# Patient Record
Sex: Female | Born: 1940 | Hispanic: No
Health system: Southern US, Community
[De-identification: ages and names within clinical notes are randomized; demographics above are authoritative.]

---

## 1998-04-29 ENCOUNTER — Other Ambulatory Visit: Admission: RE | Admit: 1998-04-29 | Discharge: 1998-04-29 | Payer: Self-pay | Admitting: Obstetrics & Gynecology

## 2000-03-23 ENCOUNTER — Encounter: Admission: RE | Admit: 2000-03-23 | Discharge: 2000-03-23 | Payer: Self-pay | Admitting: Family Medicine

## 2000-03-23 ENCOUNTER — Encounter: Payer: Self-pay | Admitting: Family Medicine

## 2001-03-27 ENCOUNTER — Encounter: Payer: Self-pay | Admitting: General Surgery

## 2001-04-02 ENCOUNTER — Inpatient Hospital Stay (HOSPITAL_COMMUNITY): Admission: RE | Admit: 2001-04-02 | Discharge: 2001-04-04 | Payer: Self-pay | Admitting: General Surgery

## 2001-04-02 ENCOUNTER — Encounter (INDEPENDENT_AMBULATORY_CARE_PROVIDER_SITE_OTHER): Payer: Self-pay

## 2002-01-29 ENCOUNTER — Encounter: Admission: RE | Admit: 2002-01-29 | Discharge: 2002-01-29 | Payer: Self-pay | Admitting: Family Medicine

## 2002-01-29 ENCOUNTER — Encounter: Payer: Self-pay | Admitting: Family Medicine

## 2003-12-05 ENCOUNTER — Encounter: Admission: RE | Admit: 2003-12-05 | Discharge: 2003-12-05 | Payer: Self-pay | Admitting: Family Medicine

## 2003-12-26 ENCOUNTER — Ambulatory Visit (HOSPITAL_COMMUNITY): Admission: RE | Admit: 2003-12-26 | Discharge: 2003-12-26 | Payer: Self-pay | Admitting: Gastroenterology

## 2004-08-20 ENCOUNTER — Other Ambulatory Visit: Admission: RE | Admit: 2004-08-20 | Discharge: 2004-08-20 | Payer: Self-pay | Admitting: Family Medicine

## 2005-10-14 ENCOUNTER — Other Ambulatory Visit: Admission: RE | Admit: 2005-10-14 | Discharge: 2005-10-14 | Payer: Self-pay | Admitting: Family Medicine

## 2005-11-18 ENCOUNTER — Encounter: Admission: RE | Admit: 2005-11-18 | Discharge: 2005-11-18 | Payer: Self-pay | Admitting: Family Medicine

## 2005-12-30 ENCOUNTER — Encounter: Admission: RE | Admit: 2005-12-30 | Discharge: 2005-12-30 | Payer: Self-pay | Admitting: Family Medicine

## 2007-02-28 ENCOUNTER — Other Ambulatory Visit: Admission: RE | Admit: 2007-02-28 | Discharge: 2007-02-28 | Payer: Self-pay | Admitting: Family Medicine

## 2008-03-05 ENCOUNTER — Other Ambulatory Visit: Admission: RE | Admit: 2008-03-05 | Discharge: 2008-03-05 | Payer: Self-pay | Admitting: Family Medicine

## 2011-04-19 ENCOUNTER — Other Ambulatory Visit: Payer: Self-pay | Admitting: Family Medicine

## 2011-04-19 ENCOUNTER — Other Ambulatory Visit (HOSPITAL_COMMUNITY)
Admission: RE | Admit: 2011-04-19 | Discharge: 2011-04-19 | Disposition: A | Discharge status: Home / Self Care | Payer: Medicare Other | Source: Ambulatory Visit | Attending: Dermatology | Admitting: Dermatology

## 2011-04-19 DIAGNOSIS — Z01419 Encounter for gynecological examination (general) (routine) without abnormal findings: Secondary | ICD-10-CM | POA: Insufficient documentation

## 2011-05-06 NOTE — Op Note (Signed)
NAME:  Catherine Randolph, Catherine Randolph                          ACCOUNT NO.:  [id number]   MEDICAL RECORD NO.:  [id number]                   PATIENT TYPE:  AMB   LOCATION:  ENDO                                 FACILITY:  [facility]   PHYSICIAN:  Graylin Shiver, M.D.                DATE OF BIRTH:  March 20, 1941   DATE OF PROCEDURE:  12/26/2003  DATE OF DISCHARGE:                                 OPERATIVE REPORT   PROCEDURE:  Colonoscopy to the hepatic flexure region.   INDICATION FOR PROCEDURE:  Screening.   Informed consent was obtained after explanation of the risks of bleeding,  infection, and perforation.   PREMEDICATIONS:  1. Fentanyl 75 mcg IV.  2. Versed 8 mg IV.   DESCRIPTION OF PROCEDURE:  With the patient in the left lateral decubitus  position, a rectal exam was performed; no masses were felt.  The Olympus  pediatric adjustable colonoscope was inserted into the rectum and advanced  around a tortuous colon to the level of the hepatic flexure.  I was able to  advance the scope just around the hepatic flexure but could not advance it  any further down into the ascending colon or to the cecum.  I placed the  patient on her back; I placed her on her right side; I placed her on her  abdomen but despite these different positions and also flank pressure to  various points on the abdomen, the scope would not advance.  The patient  __________ cholecystectomy.  The scope was brought out visualizing the  colonic mucosa.  No abnormalities were seen.  She tolerated the procedure  well without complications.   IMPRESSION:  Incomplete colonoscopy to the region of the hepatic flexure  with no abnormalities seen up to this point.   PLAN:  I will order a barium enema to evaluate the more proximal colon.                                               Graylin Shiver, M.D.    SFG/MEDQ  D:  12/26/2003  T:  12/26/2003  Job:  [id number]   cc:   Holley Bouche, M.D.  [address]  [place], [place]  [address]  Fax: [phone]

## 2011-05-06 NOTE — Op Note (Signed)
[facility]  Patient:    Catherine Randolph, Catherine Randolph                MRN: [id number] Proc. Date: 04/02/01 Attending:  Angelia Mould. Derrell Lolling, M.D. CC:         Arvella Merles, M.D.   Operative Report  PREOPERATIVE DIAGNOSES: 1. Incarcerated ventral incisional hernia. 2. Gallstones.  POSTOPERATIVE DIAGNOSES: 1. Incarcerated ventral incisional hernia. 2. Gallstones.  PROCEDURES: 1. Laparoscopic repair of incarcerated ventral hernia with dual mesh. 2. Laparoscopic cholecystectomy.  SURGEON:  Angelia Mould. Derrell Lolling, M.D.  FIRST ASSISTANT:  Currie Paris, M.D.  OPERATIVE INDICATIONS:  This is a 70 year old white female who has had two C-sections in the past.  She has noticed a progressive bulge and some discomfort below and to the left of her umbilicus for about two years, and she thinks this is getting worse.  In the more distant past, she has had a couple of episodes of upper abdominal pain radiating to her back after meals but has not had any more severe attacks like that.  She has minimal indigestion. Recent ultrasound shows the gallbladder completely filled with stones, and the posterior wall of the gallbladder could not be well seen.  The liver and bile ducts otherwise looked normal.  Liver function tests are normal.  CT scan of the abdomen and pelvis recently showed ventral hernia in the lower abdominal wall just to the left of midline with a loop of small bowel present within the hernia but not obstructed.  She was seen in surgical consultation.  I felt that she was at significant longterm risk of incarceration and strangulation of her ventral hernia and also felt that it would be reasonable to remove her gallbladder at the same time as the surgery.  She is brought to the operating room electively.  OPERATIVE FINDINGS:  The patient had an incarcerated ventral hernia with a lot of omentum and one loop of small bowel caught within the hernia.  We  were able to dissect the omentum out of the hernia and to tease the small bowel out without any trauma whatsoever.  The rest of the small intestine and large intestine looks normal.  The liver appeared healthy.  The gallbladder was chronically inflamed, clearly thick-walled, fairly extensive adhesions to it with a lot of fibrosis on the posterior wall and fairly sticky dissection of the gallbladder out of the liver bed.  The anatomy of the cystic duct, cystic artery, and common bile duct appeared conventional.  The stomach and duodenum appeared normal.  DESCRIPTION OF PROCEDURE:  Following the induction of general endotracheal anesthesia, a Foley catheter and oral-gastric tube were inserted.  The abdomen was prepped and draped in a sterile fashion.  The initial trocar was placed in the left flank.  A transverse incision was made in the left flank at about the anterior axillary line.  The fascia was incised.  Retractors were placed, and the peritoneum was identified.  The peritoneum was elevated with hemostats and incised and the abdominal cavity entered under direct vision.  A 10 mm Hasson trocar was inserted and secured with a pursestring suture of 0 Vicryl. Pneumoperitoneum was created.  The video camera was inserted with the visualization and findings as described above.  We placed a 5 mm trocar in the left lower quadrant under direct vision.  We placed a 10 mm trocar in the midline about 4 cm above the umbilicus.  We felt that we could use this to operate  upon the lower abdominal ventral hernia as well as perform the cholecystectomy.  Using blunt dissection and counterpulsation from the abdominal wall and the Harmonic scalpel, we dissected the omentum out of the hernia defect.  We very carefully identified the loop of small bowel that was within the hernia, and we were able to tease this out of the sac atraumatically and divide the omental attachments well away from the bowel.  Once  we had completely reduced all the hernia contents, we examined the small bowel carefully, and it appeared to be fine.  The defect in the fascia was noted in the lower abdominal wall just to the left of the midline.  The defect was about 6 or 7 cm in diameter, and above this in the midline there was apparently some weakness in the fascia.  We used spinal needles to mark the edges of the hernia.  Marking pen was used.  We ultimately drew a template on the abdominal wall of the intended repair and marked an area 15 x 15 cm.  We removed the spinal needles.  We drew the template on the abdominal wall and marked the 12 oclock, 3 oclock, 6 oclock, and 9 oclock positions.  A piece of dual mesh was brought to the operative field, which was slightly larger than the template, and it was cut in appropriate shape to match the intended repair of the defect.  The mesh was marked to correspond with the abdominal wall markings.  Four interrupted sutures of #1 Novofil were placed in the mesh at the 12 oclock, 3 oclock, 6 oclock, and 9 oclock positions.  After marking the mesh, the mesh was rolled up and inserted into the abdominal cavity. Under direct vision, the mesh was then unrolled so that the smooth side was placed toward the viscera and the rough side placed toward the abdominal wall peritoneal surface.  The Novofil sutures were brought through the abdominal wall peripheral to the marked template.  These were brought through the abdominal wall separately with an Endoclose device.  We tried to grab about a 1.5 cm bite of fascia around the sutures.  Once we had all four fixation points in place, we drew them all tight and the mesh appeared to be deployed properly and without a lot of redundancy.  We then tied all four sutures with about nine or 10 square knots and cut the sutures.  We then further secured the mesh at the periphery with 5 mm tacking device.  We were careful to palpate each tack as it was  secured through the mesh of the abdominal wall with a hand on the abdominal wall.  We placed these tacks about 1 cm apart  around the periphery of the mesh and then placed eight or 10 tacks more centrally to hold the mesh up against the abdominal wall.  Photographs were taken.  The mesh appeared to be deployed quite nicely and smoothly.  We checked the small bowel large bowel, and there was no bleeding.  We then repositioned the patient in a reverse Trendelenburg position and turned to the left.  The camera was inserted in the 10 mm trocar above the umbilicus.  A 10 mm trocar was placed in the subxiphoid region and two 5 mm trocars placed in the right upper quadrant.  We identified the gallbladder fundus and elevated it.  Adhesions were taken down until we could identify the infundibulum of the gallbladder.  That was then grasped and retracted laterally.  We  dissected out the cystic duct and cystic artery quite carefully.  We isolated the cystic artery after it went onto the gallbladder wall, secured it with metal clips, and divided it.  We then dissected out a fairly long length of cystic duct and created a large window behind the gallbladder and cystic duct to be sure of our anatomy.  There was a small stone in the cystic duct, which we milked back up into the gallbladder.  The cystic duct was then secured with multiple metal clips and divided.  The gallbladder was dissected from its bed with electrocautery.  We got one small hole in the gallbladder and spilled a little bit of bile in the subhepatic space, but that was retrieved easily and there was minimal contamination. After we dissected the gallbladder completely away from its bed, we placed it in a specimen bag and removed it through the umbilical port after opening the fascia up a little bit.  We reinserted the trocars.  We spent a great deal of time irrigating the subphrenic space and subhepatic space, the abdominal cavity, and  the pelvis with probably 6 or 7 L of saline to make sure there was no bleeding and no bile leak whatsoever.  There was a small laceration of the liver between the gallbladder bed and falciform ligament, which occurred early in the gallbladder dissection.  At the completion of the case, that was completely hemostatic, and there was no bleeding or bile leak from that or from anywhere else in the area of dissection.  Once we were satisfied that the operative field was completely clear and that we had removed all the irrigation fluid, we removed the trocars under direct vision.  There was no bleeding from any trocar site.  The pneumoperitoneum was released.  The fascia above the umbilicus was closed with interrupted sutures of 0 Novofil.  About four sutures were placed.  The fascia on the left flank trocar site was closed with 0 Novofil interrupted sutures as well.  The wounds were irrigated with saline.  The skin incisions were closed with staples and Steri-Strips.  Clean bandages were placed and the patient taken to the recovery room in stable condition.  Estimated blood loss was about 30 cc.  Complications:  None. Sponge, needle, and instrument counts were correct. DD:  04/02/01 TD:  04/02/01 Job: [id number]

## 2014-01-09 ENCOUNTER — Ambulatory Visit
Admission: RE | Admit: 2014-01-09 | Discharge: 2014-01-09 | Disposition: A | Discharge status: Home / Self Care | Payer: Medicare Other | Source: Ambulatory Visit | Attending: Family Medicine | Admitting: Family Medicine

## 2014-01-09 ENCOUNTER — Other Ambulatory Visit: Payer: Self-pay | Admitting: Family Medicine

## 2014-01-09 DIAGNOSIS — J209 Acute bronchitis, unspecified: Secondary | ICD-10-CM

## 2014-03-04 ENCOUNTER — Other Ambulatory Visit: Payer: Self-pay | Admitting: Gastroenterology

## 2014-03-04 DIAGNOSIS — Z139 Encounter for screening, unspecified: Secondary | ICD-10-CM

## 2014-05-02 ENCOUNTER — Other Ambulatory Visit: Payer: Medicare Other

## 2016-02-29 ENCOUNTER — Other Ambulatory Visit: Payer: Self-pay | Admitting: Family Medicine

## 2016-02-29 DIAGNOSIS — Z1211 Encounter for screening for malignant neoplasm of colon: Secondary | ICD-10-CM

## 2016-04-25 ENCOUNTER — Ambulatory Visit
Admission: RE | Admit: 2016-04-25 | Discharge: 2016-04-25 | Disposition: A | Discharge status: Home / Self Care | Payer: Medicare Other | Source: Ambulatory Visit | Attending: Family Medicine | Admitting: Family Medicine

## 2016-04-25 DIAGNOSIS — Z1211 Encounter for screening for malignant neoplasm of colon: Secondary | ICD-10-CM

## 2017-07-21 ENCOUNTER — Other Ambulatory Visit (HOSPITAL_COMMUNITY): Payer: Self-pay | Admitting: Family Medicine

## 2017-07-21 ENCOUNTER — Ambulatory Visit (HOSPITAL_COMMUNITY)
Admission: RE | Admit: 2017-07-21 | Discharge: 2017-07-21 | Disposition: A | Discharge status: Home / Self Care | Payer: Medicare Other | Source: Ambulatory Visit | Attending: Family Medicine | Admitting: Family Medicine

## 2017-07-21 DIAGNOSIS — M79605 Pain in left leg: Secondary | ICD-10-CM | POA: Diagnosis not present

## 2017-07-21 DIAGNOSIS — M7989 Other specified soft tissue disorders: Principal | ICD-10-CM

## 2017-07-21 NOTE — Progress Notes (Signed)
*  Preliminary Results* Left lower extremity venous duplex completed. Left lower extremity is negative for deep vein thrombosis. There is no evidence of left Baker's cyst.  07/21/2017 3:43 PM  Gertie FeyMichelle Chrysa Rampy, BS, RVT, RDCS, RDMS

## 2018-04-11 IMAGING — RF DG BE W/ CM - WO/W KUB
17 of 20 series · 17 of 20 positions shown · IV contrast (agent unspecified)
Comparison: 12/26/2003

CLINICAL DATA: Colon cancer screening.  Incomplete colonoscopy.

EXAM:
BE WITH CONTRAST - WITHOUT AND WITH KUB
CONTRAST:  Standard barium
FLUOROSCOPY TIME:  Radiation Exposure Index (as provided by the
fluoroscopic device): 251 dGY cm2

[Series 1: run · 1 of 1 slices shown (1 of 15)]
[im 1/1]
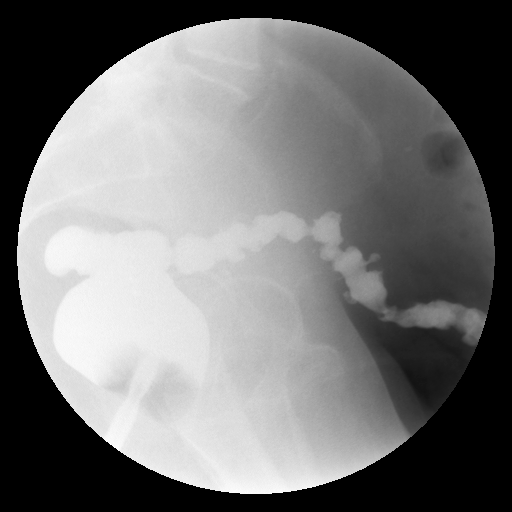

[Series 2: run · 1 of 1 slices shown (2 of 15)]
[im 1/1]
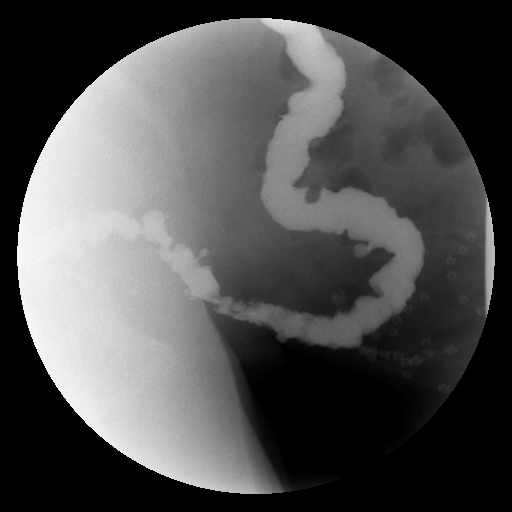

[Series 3: run · 1 of 1 slices shown (3 of 15)]
[im 1/1]
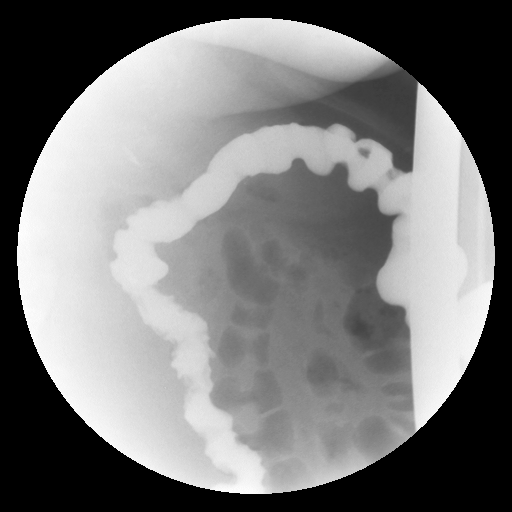

[Series 5: run · 1 of 1 slices shown (4 of 15)]
[im 1/1]
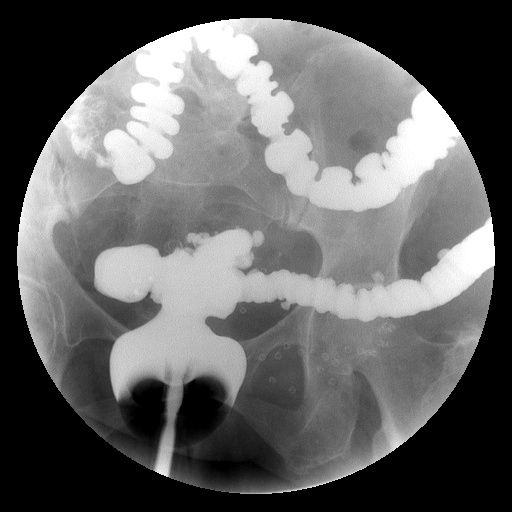

[Series 6: run · 1 of 1 slices shown (5 of 15)]
[im 1/1]
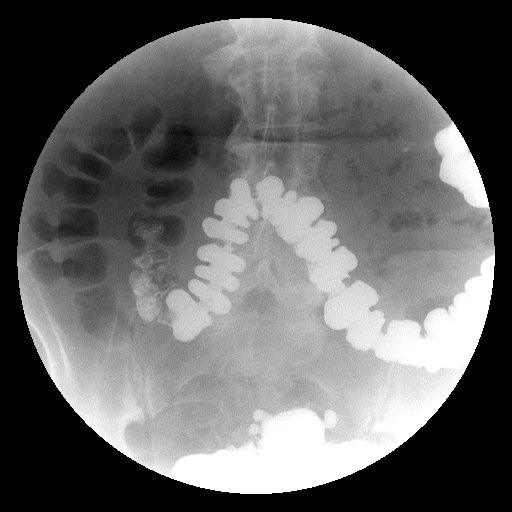

[Series 7: run · 1 of 1 slices shown (6 of 15)]
[im 1/1]
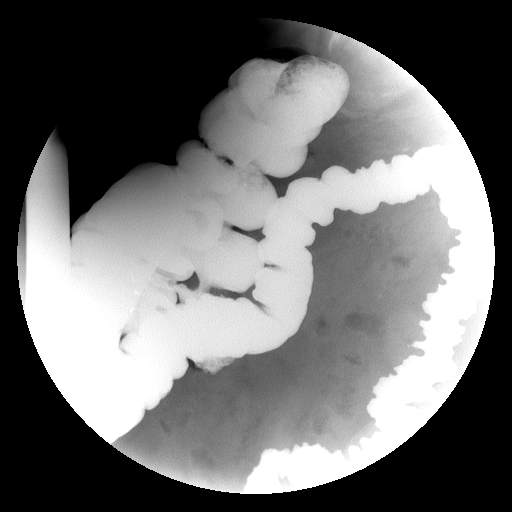

[Series 8: run · 1 of 1 slices shown (7 of 15)]
[im 1/1]
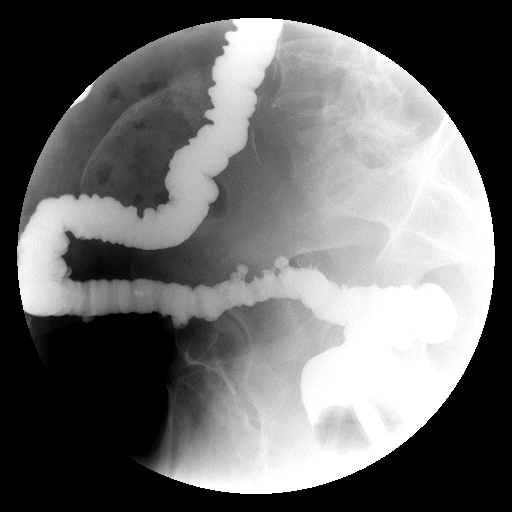

[Series 9: run · 1 of 1 slices shown (8 of 15)]
[im 1/1]
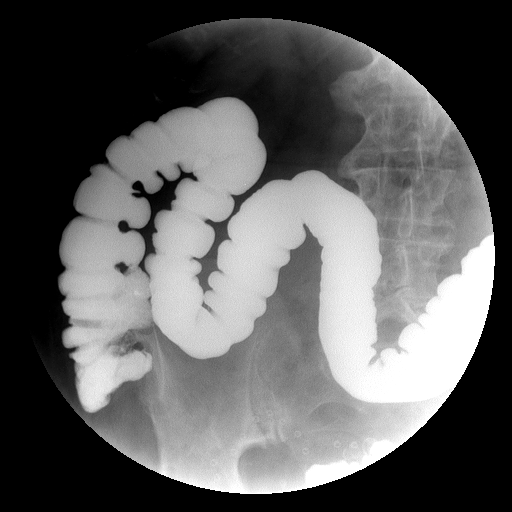

[Series 11: run · 1 of 1 slices shown (9 of 15)]
[im 1/1]
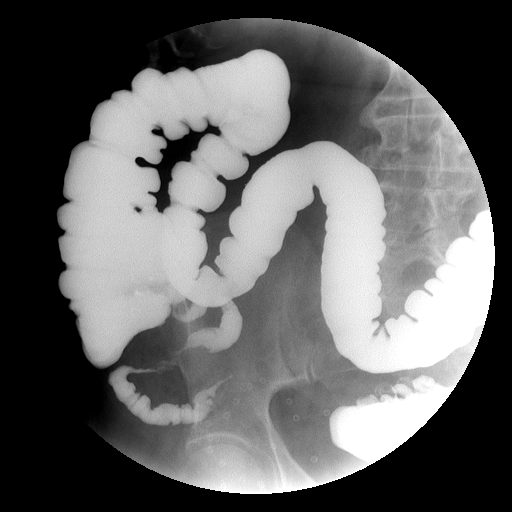

[Series 12: run · 1 of 1 slices shown (10 of 15)]
[im 1/1]
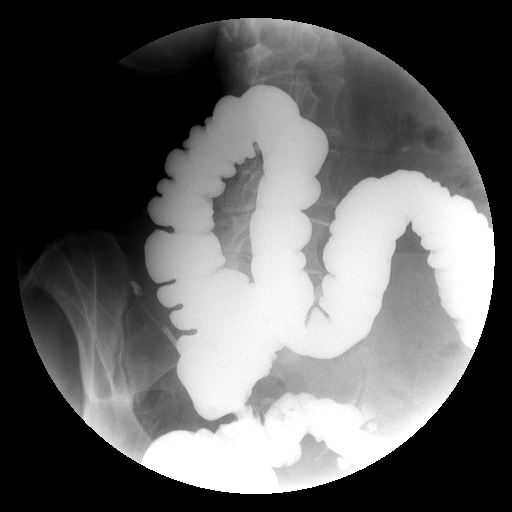

[Series 13: run · 1 of 1 slices shown (11 of 15)]
[im 1/1]
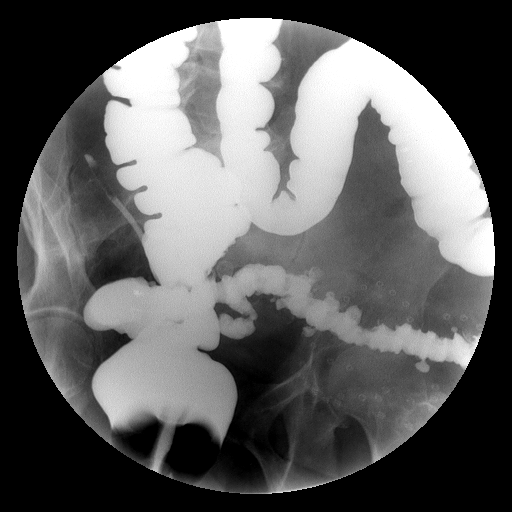

[Series 14: run · 1 of 1 slices shown (12 of 15)]
[im 1/1]
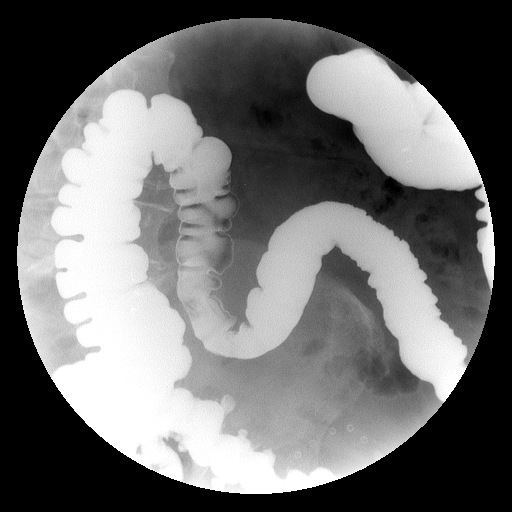

[Series 15: run · 1 of 1 slices shown (13 of 15)]
[im 1/1]
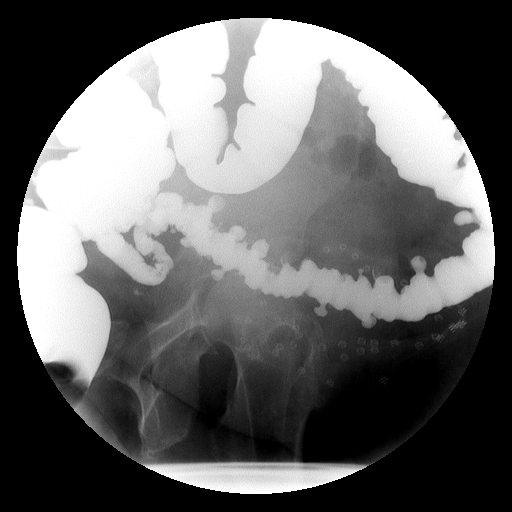

[Series 16: run · 1 of 1 slices shown (14 of 15)]
[im 1/1]
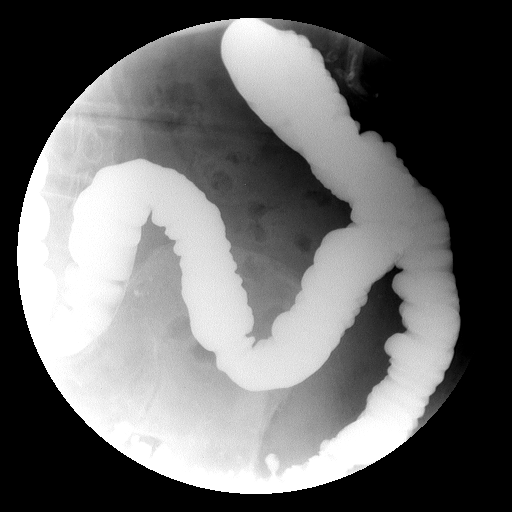

[Series 18: run · 1 of 1 slices shown (15 of 15)]
[im 1/1]
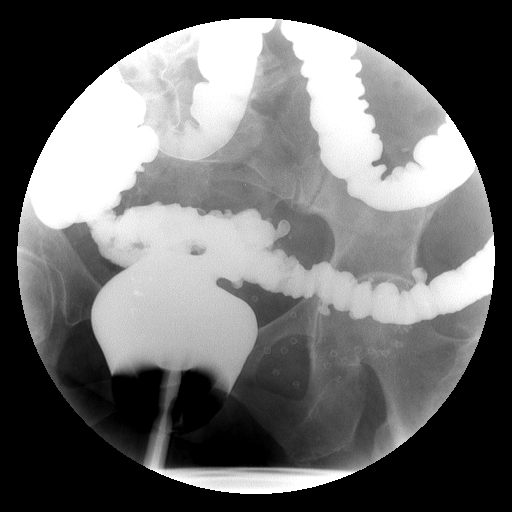

[Series 1001: view not recorded · 0.20mm/px · 1 of 1 slices shown (1 of 2)]
[im 1/1]
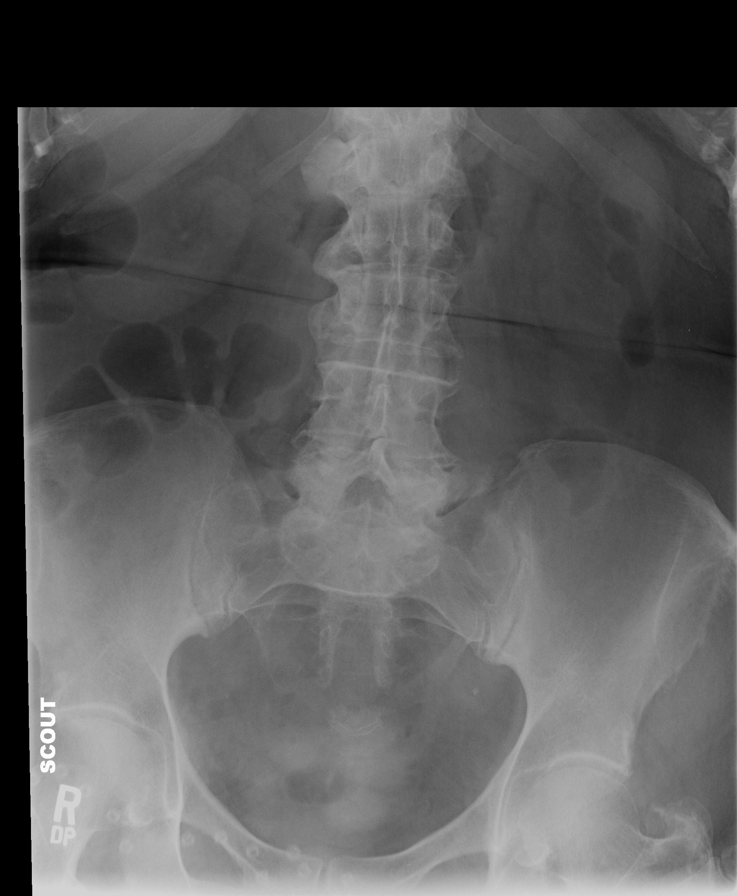

[Series 1002: view not recorded · 0.20mm/px · 1 of 1 slices shown (2 of 2)]
[im 1/1]
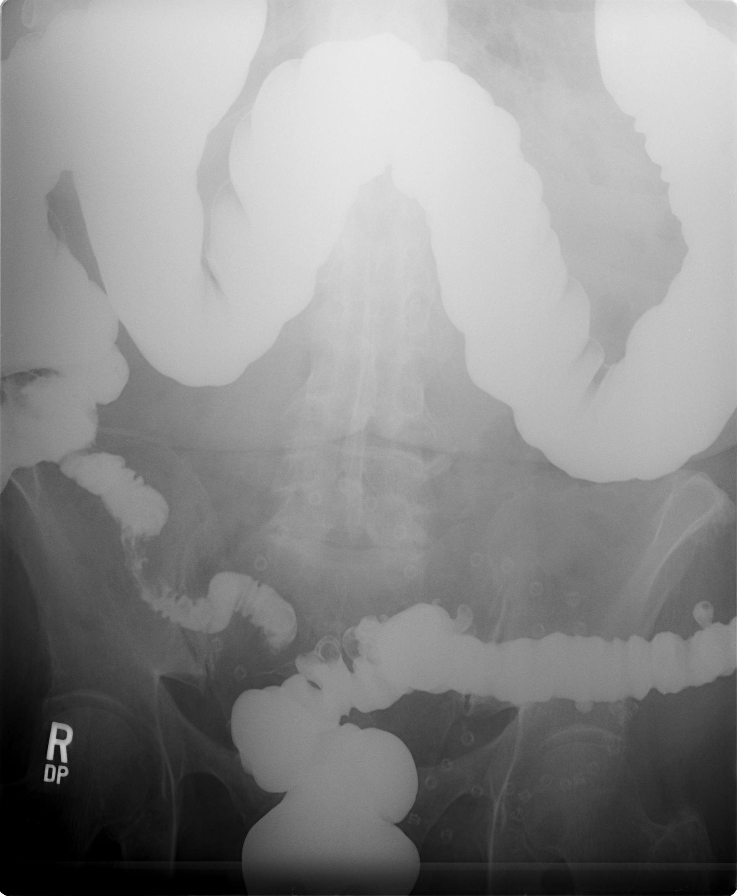

[17 of 20 positions shown; findings below may reference images not displayed]

FINDINGS: Right KUB demonstrates evidence of previous cholecystectomy a.m.
previous right inguinal hernia repair. Arthritic changes throughout
the lumbar spine most severe at L4-5 and L5-S1.

The entire colon was filled in retrograde manner. There are multiple
diverticula in the sigmoid portion of the colon. The remainder of
the colon appears normal. There was reflux into the normal appearing
terminal ileum. The appendix appears normal.

No mass lesions or visible polyps.
IMPRESSION: Diverticulosis of the sigmoid portion of the colon slightly
increased since the prior exam popped. Otherwise, normal exam.

## 2019-10-14 DIAGNOSIS — S82009A Unspecified fracture of unspecified patella, initial encounter for closed fracture: Secondary | ICD-10-CM | POA: Insufficient documentation

## 2020-06-08 DIAGNOSIS — E1351 Other specified diabetes mellitus with diabetic peripheral angiopathy without gangrene: Secondary | ICD-10-CM | POA: Diagnosis not present

## 2020-06-08 DIAGNOSIS — L84 Corns and callosities: Secondary | ICD-10-CM | POA: Diagnosis not present

## 2020-06-08 DIAGNOSIS — L602 Onychogryphosis: Secondary | ICD-10-CM | POA: Diagnosis not present

## 2020-08-03 ENCOUNTER — Ambulatory Visit
Admission: RE | Admit: 2020-08-03 | Discharge: 2020-08-03 | Disposition: A | Discharge status: Home / Self Care | Payer: Medicare PPO | Source: Ambulatory Visit | Attending: Family Medicine | Admitting: Family Medicine

## 2020-08-03 ENCOUNTER — Other Ambulatory Visit: Payer: Self-pay

## 2020-08-03 ENCOUNTER — Other Ambulatory Visit: Payer: Self-pay | Admitting: Family Medicine

## 2020-08-03 DIAGNOSIS — Z0000 Encounter for general adult medical examination without abnormal findings: Secondary | ICD-10-CM | POA: Diagnosis not present

## 2020-08-03 DIAGNOSIS — M25511 Pain in right shoulder: Secondary | ICD-10-CM | POA: Diagnosis not present

## 2020-08-03 DIAGNOSIS — I10 Essential (primary) hypertension: Secondary | ICD-10-CM | POA: Diagnosis not present

## 2020-08-03 DIAGNOSIS — F419 Anxiety disorder, unspecified: Secondary | ICD-10-CM | POA: Diagnosis not present

## 2020-08-03 DIAGNOSIS — M546 Pain in thoracic spine: Secondary | ICD-10-CM

## 2020-08-03 DIAGNOSIS — E7800 Pure hypercholesterolemia, unspecified: Secondary | ICD-10-CM | POA: Diagnosis not present

## 2020-08-03 DIAGNOSIS — M47814 Spondylosis without myelopathy or radiculopathy, thoracic region: Secondary | ICD-10-CM | POA: Diagnosis not present

## 2020-08-03 DIAGNOSIS — Z1211 Encounter for screening for malignant neoplasm of colon: Secondary | ICD-10-CM | POA: Diagnosis not present

## 2020-08-03 DIAGNOSIS — E1142 Type 2 diabetes mellitus with diabetic polyneuropathy: Secondary | ICD-10-CM | POA: Diagnosis not present

## 2020-08-31 DIAGNOSIS — L602 Onychogryphosis: Secondary | ICD-10-CM | POA: Diagnosis not present

## 2020-08-31 DIAGNOSIS — E1351 Other specified diabetes mellitus with diabetic peripheral angiopathy without gangrene: Secondary | ICD-10-CM | POA: Diagnosis not present

## 2020-09-14 DIAGNOSIS — Z1231 Encounter for screening mammogram for malignant neoplasm of breast: Secondary | ICD-10-CM | POA: Diagnosis not present

## 2020-12-03 DIAGNOSIS — H5203 Hypermetropia, bilateral: Secondary | ICD-10-CM | POA: Diagnosis not present

## 2020-12-03 DIAGNOSIS — E119 Type 2 diabetes mellitus without complications: Secondary | ICD-10-CM | POA: Diagnosis not present

## 2020-12-03 DIAGNOSIS — H401122 Primary open-angle glaucoma, left eye, moderate stage: Secondary | ICD-10-CM | POA: Diagnosis not present

## 2020-12-03 DIAGNOSIS — H35351 Cystoid macular degeneration, right eye: Secondary | ICD-10-CM | POA: Diagnosis not present

## 2020-12-14 DIAGNOSIS — E1351 Other specified diabetes mellitus with diabetic peripheral angiopathy without gangrene: Secondary | ICD-10-CM | POA: Diagnosis not present

## 2020-12-14 DIAGNOSIS — L84 Corns and callosities: Secondary | ICD-10-CM | POA: Diagnosis not present

## 2020-12-14 DIAGNOSIS — L602 Onychogryphosis: Secondary | ICD-10-CM | POA: Diagnosis not present

## 2021-02-03 DIAGNOSIS — E119 Type 2 diabetes mellitus without complications: Secondary | ICD-10-CM | POA: Diagnosis not present

## 2021-02-03 DIAGNOSIS — E7800 Pure hypercholesterolemia, unspecified: Secondary | ICD-10-CM | POA: Diagnosis not present

## 2021-02-03 DIAGNOSIS — I10 Essential (primary) hypertension: Secondary | ICD-10-CM | POA: Diagnosis not present

## 2021-02-22 DIAGNOSIS — L602 Onychogryphosis: Secondary | ICD-10-CM | POA: Diagnosis not present

## 2021-02-22 DIAGNOSIS — I739 Peripheral vascular disease, unspecified: Secondary | ICD-10-CM | POA: Diagnosis not present

## 2021-04-20 DIAGNOSIS — E1351 Other specified diabetes mellitus with diabetic peripheral angiopathy without gangrene: Secondary | ICD-10-CM | POA: Diagnosis not present

## 2021-04-20 DIAGNOSIS — M79674 Pain in right toe(s): Secondary | ICD-10-CM | POA: Diagnosis not present

## 2021-04-20 DIAGNOSIS — I739 Peripheral vascular disease, unspecified: Secondary | ICD-10-CM | POA: Diagnosis not present

## 2021-04-20 DIAGNOSIS — M79675 Pain in left toe(s): Secondary | ICD-10-CM | POA: Diagnosis not present

## 2021-05-03 DIAGNOSIS — I739 Peripheral vascular disease, unspecified: Secondary | ICD-10-CM | POA: Diagnosis not present

## 2021-05-03 DIAGNOSIS — L602 Onychogryphosis: Secondary | ICD-10-CM | POA: Diagnosis not present

## 2021-06-07 DIAGNOSIS — R130 Aphagia: Secondary | ICD-10-CM | POA: Diagnosis not present

## 2021-06-16 DIAGNOSIS — U071 COVID-19: Secondary | ICD-10-CM | POA: Diagnosis not present

## 2021-07-13 DIAGNOSIS — L84 Corns and callosities: Secondary | ICD-10-CM | POA: Diagnosis not present

## 2021-07-13 DIAGNOSIS — E1142 Type 2 diabetes mellitus with diabetic polyneuropathy: Secondary | ICD-10-CM | POA: Diagnosis not present

## 2021-07-13 DIAGNOSIS — L603 Nail dystrophy: Secondary | ICD-10-CM | POA: Diagnosis not present

## 2021-07-13 DIAGNOSIS — L602 Onychogryphosis: Secondary | ICD-10-CM | POA: Diagnosis not present

## 2021-08-06 DIAGNOSIS — I10 Essential (primary) hypertension: Secondary | ICD-10-CM | POA: Diagnosis not present

## 2021-08-06 DIAGNOSIS — M85859 Other specified disorders of bone density and structure, unspecified thigh: Secondary | ICD-10-CM | POA: Diagnosis not present

## 2021-08-06 DIAGNOSIS — E1142 Type 2 diabetes mellitus with diabetic polyneuropathy: Secondary | ICD-10-CM | POA: Diagnosis not present

## 2021-08-06 DIAGNOSIS — Z0000 Encounter for general adult medical examination without abnormal findings: Secondary | ICD-10-CM | POA: Diagnosis not present

## 2021-08-06 DIAGNOSIS — E7800 Pure hypercholesterolemia, unspecified: Secondary | ICD-10-CM | POA: Diagnosis not present

## 2021-08-06 DIAGNOSIS — Z23 Encounter for immunization: Secondary | ICD-10-CM | POA: Diagnosis not present

## 2021-09-16 DIAGNOSIS — M85852 Other specified disorders of bone density and structure, left thigh: Secondary | ICD-10-CM | POA: Diagnosis not present

## 2021-09-16 DIAGNOSIS — Z1231 Encounter for screening mammogram for malignant neoplasm of breast: Secondary | ICD-10-CM | POA: Diagnosis not present

## 2021-10-12 DIAGNOSIS — L603 Nail dystrophy: Secondary | ICD-10-CM | POA: Diagnosis not present

## 2021-10-12 DIAGNOSIS — E1151 Type 2 diabetes mellitus with diabetic peripheral angiopathy without gangrene: Secondary | ICD-10-CM | POA: Diagnosis not present

## 2021-10-12 DIAGNOSIS — I70203 Unspecified atherosclerosis of native arteries of extremities, bilateral legs: Secondary | ICD-10-CM | POA: Diagnosis not present

## 2021-10-12 DIAGNOSIS — B351 Tinea unguium: Secondary | ICD-10-CM | POA: Diagnosis not present

## 2021-10-12 DIAGNOSIS — Z23 Encounter for immunization: Secondary | ICD-10-CM | POA: Diagnosis not present

## 2021-10-12 DIAGNOSIS — E1142 Type 2 diabetes mellitus with diabetic polyneuropathy: Secondary | ICD-10-CM | POA: Diagnosis not present

## 2021-10-12 DIAGNOSIS — L84 Corns and callosities: Secondary | ICD-10-CM | POA: Diagnosis not present

## 2022-01-11 DIAGNOSIS — L603 Nail dystrophy: Secondary | ICD-10-CM | POA: Diagnosis not present

## 2022-01-11 DIAGNOSIS — E119 Type 2 diabetes mellitus without complications: Secondary | ICD-10-CM | POA: Diagnosis not present

## 2022-01-11 DIAGNOSIS — E1142 Type 2 diabetes mellitus with diabetic polyneuropathy: Secondary | ICD-10-CM | POA: Diagnosis not present

## 2022-01-11 DIAGNOSIS — M792 Neuralgia and neuritis, unspecified: Secondary | ICD-10-CM | POA: Diagnosis not present

## 2022-01-11 DIAGNOSIS — E1151 Type 2 diabetes mellitus with diabetic peripheral angiopathy without gangrene: Secondary | ICD-10-CM | POA: Diagnosis not present

## 2022-01-11 DIAGNOSIS — I70203 Unspecified atherosclerosis of native arteries of extremities, bilateral legs: Secondary | ICD-10-CM | POA: Diagnosis not present

## 2022-01-11 DIAGNOSIS — M19072 Primary osteoarthritis, left ankle and foot: Secondary | ICD-10-CM | POA: Diagnosis not present

## 2022-01-11 DIAGNOSIS — L84 Corns and callosities: Secondary | ICD-10-CM | POA: Diagnosis not present

## 2022-01-11 DIAGNOSIS — B351 Tinea unguium: Secondary | ICD-10-CM | POA: Diagnosis not present

## 2022-01-25 DIAGNOSIS — E119 Type 2 diabetes mellitus without complications: Secondary | ICD-10-CM | POA: Diagnosis not present

## 2022-01-25 DIAGNOSIS — E7800 Pure hypercholesterolemia, unspecified: Secondary | ICD-10-CM | POA: Diagnosis not present

## 2022-01-25 DIAGNOSIS — I10 Essential (primary) hypertension: Secondary | ICD-10-CM | POA: Diagnosis not present

## 2022-02-15 DIAGNOSIS — L603 Nail dystrophy: Secondary | ICD-10-CM | POA: Diagnosis not present

## 2022-02-15 DIAGNOSIS — L84 Corns and callosities: Secondary | ICD-10-CM | POA: Diagnosis not present

## 2022-02-15 DIAGNOSIS — E1151 Type 2 diabetes mellitus with diabetic peripheral angiopathy without gangrene: Secondary | ICD-10-CM | POA: Diagnosis not present

## 2022-02-15 DIAGNOSIS — M19071 Primary osteoarthritis, right ankle and foot: Secondary | ICD-10-CM | POA: Diagnosis not present

## 2022-02-15 DIAGNOSIS — M19072 Primary osteoarthritis, left ankle and foot: Secondary | ICD-10-CM | POA: Diagnosis not present

## 2022-02-15 DIAGNOSIS — B351 Tinea unguium: Secondary | ICD-10-CM | POA: Diagnosis not present

## 2022-02-15 DIAGNOSIS — M792 Neuralgia and neuritis, unspecified: Secondary | ICD-10-CM | POA: Diagnosis not present

## 2022-02-15 DIAGNOSIS — E1142 Type 2 diabetes mellitus with diabetic polyneuropathy: Secondary | ICD-10-CM | POA: Diagnosis not present

## 2022-02-15 DIAGNOSIS — I70203 Unspecified atherosclerosis of native arteries of extremities, bilateral legs: Secondary | ICD-10-CM | POA: Diagnosis not present

## 2022-03-22 DIAGNOSIS — I70203 Unspecified atherosclerosis of native arteries of extremities, bilateral legs: Secondary | ICD-10-CM | POA: Diagnosis not present

## 2022-03-22 DIAGNOSIS — M19071 Primary osteoarthritis, right ankle and foot: Secondary | ICD-10-CM | POA: Diagnosis not present

## 2022-03-22 DIAGNOSIS — L84 Corns and callosities: Secondary | ICD-10-CM | POA: Diagnosis not present

## 2022-03-22 DIAGNOSIS — E1151 Type 2 diabetes mellitus with diabetic peripheral angiopathy without gangrene: Secondary | ICD-10-CM | POA: Diagnosis not present

## 2022-03-22 DIAGNOSIS — B351 Tinea unguium: Secondary | ICD-10-CM | POA: Diagnosis not present

## 2022-03-22 DIAGNOSIS — L603 Nail dystrophy: Secondary | ICD-10-CM | POA: Diagnosis not present

## 2022-03-22 DIAGNOSIS — M792 Neuralgia and neuritis, unspecified: Secondary | ICD-10-CM | POA: Diagnosis not present

## 2022-03-22 DIAGNOSIS — M19072 Primary osteoarthritis, left ankle and foot: Secondary | ICD-10-CM | POA: Diagnosis not present

## 2022-04-06 DIAGNOSIS — I10 Essential (primary) hypertension: Secondary | ICD-10-CM | POA: Diagnosis not present

## 2022-04-06 DIAGNOSIS — H6123 Impacted cerumen, bilateral: Secondary | ICD-10-CM | POA: Diagnosis not present

## 2022-04-19 DIAGNOSIS — R7309 Other abnormal glucose: Secondary | ICD-10-CM | POA: Diagnosis not present

## 2022-06-08 DIAGNOSIS — I70203 Unspecified atherosclerosis of native arteries of extremities, bilateral legs: Secondary | ICD-10-CM | POA: Diagnosis not present

## 2022-06-08 DIAGNOSIS — L603 Nail dystrophy: Secondary | ICD-10-CM | POA: Diagnosis not present

## 2022-06-08 DIAGNOSIS — E1151 Type 2 diabetes mellitus with diabetic peripheral angiopathy without gangrene: Secondary | ICD-10-CM | POA: Diagnosis not present

## 2022-06-08 DIAGNOSIS — L84 Corns and callosities: Secondary | ICD-10-CM | POA: Diagnosis not present

## 2022-06-08 DIAGNOSIS — M19072 Primary osteoarthritis, left ankle and foot: Secondary | ICD-10-CM | POA: Diagnosis not present

## 2022-06-08 DIAGNOSIS — M19071 Primary osteoarthritis, right ankle and foot: Secondary | ICD-10-CM | POA: Diagnosis not present

## 2022-06-08 DIAGNOSIS — B351 Tinea unguium: Secondary | ICD-10-CM | POA: Diagnosis not present

## 2022-06-08 DIAGNOSIS — M792 Neuralgia and neuritis, unspecified: Secondary | ICD-10-CM | POA: Diagnosis not present

## 2022-07-15 DIAGNOSIS — H25013 Cortical age-related cataract, bilateral: Secondary | ICD-10-CM | POA: Diagnosis not present

## 2022-07-15 DIAGNOSIS — H5203 Hypermetropia, bilateral: Secondary | ICD-10-CM | POA: Diagnosis not present

## 2022-07-15 DIAGNOSIS — H2513 Age-related nuclear cataract, bilateral: Secondary | ICD-10-CM | POA: Diagnosis not present

## 2022-07-20 IMAGING — CR DG CHEST 2V
2 series · 2 of 2 positions shown · non-contrast
Comparison: Radiograph 01/09/2014

CLINICAL DATA: Right posterior thoracic pain, fall 3 months ago

EXAM:
CHEST - 2 VIEW

[w chest pa (1 of 2)]
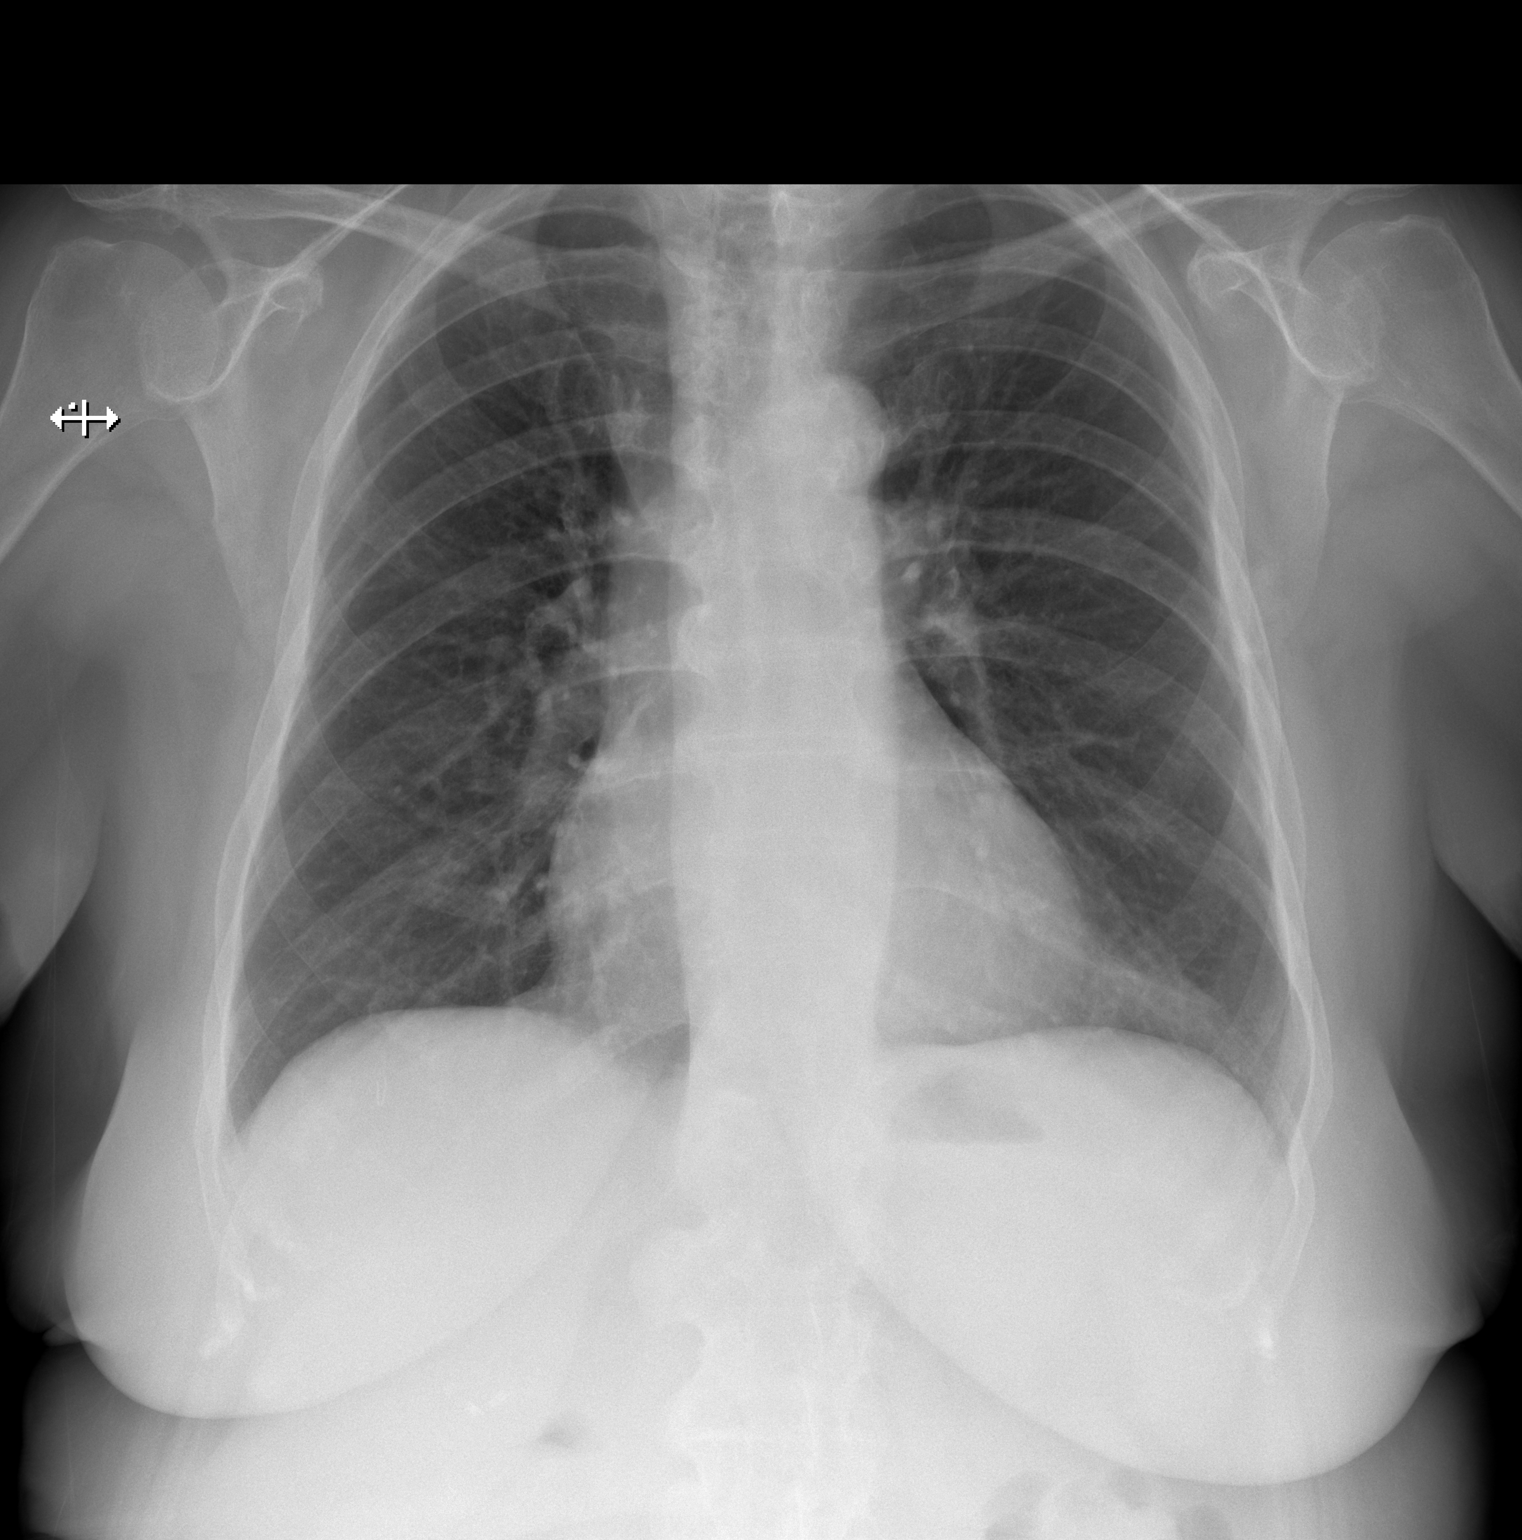

[w chest pa (2 of 2)]
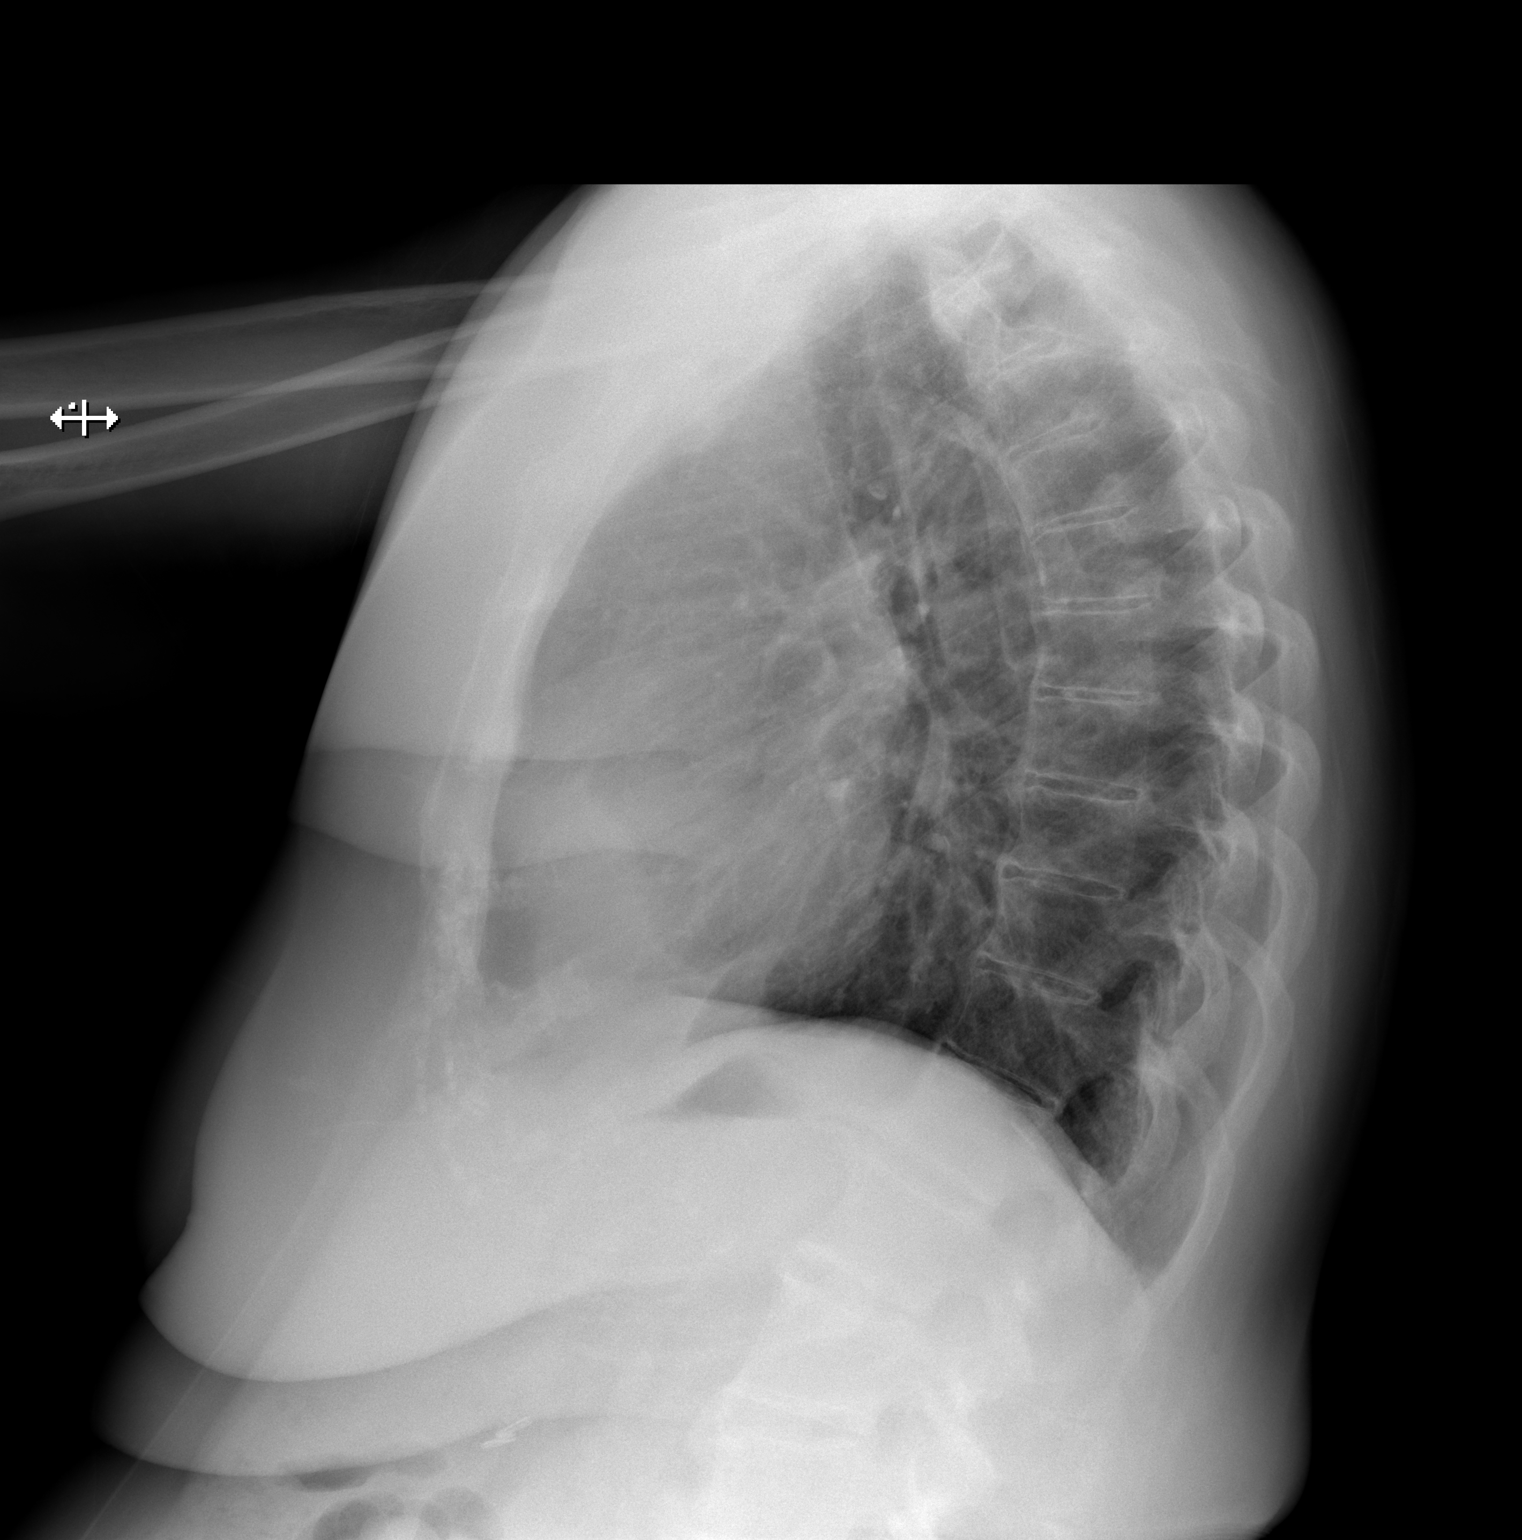

[2 of 2 positions shown; findings below may reference images not displayed]

FINDINGS: No consolidation, features of edema, pneumothorax, or effusion.
Pulmonary vascularity is normally distributed. The cardiomediastinal
contours are unremarkable. No acute or healing fractures nor other
acute osseous abnormality. Multilevel degenerative changes are
present in the imaged portions of the spine. Multilevel flowing
anterior osteophytosis, compatible with features of diffuse
idiopathic skeletal hyperostosis (DISH). No worrisome soft tissue
findings. Surgical clips in the right upper quadrant, likely related
to prior cholecystectomy.
IMPRESSION: 1. No active cardiopulmonary disease.
2. No acute or healing fractures in the chest.
3. Degenerative changes in the spine and shoulders, slightly
progressed from prior.

## 2022-08-18 DIAGNOSIS — M19071 Primary osteoarthritis, right ankle and foot: Secondary | ICD-10-CM | POA: Diagnosis not present

## 2022-08-18 DIAGNOSIS — L603 Nail dystrophy: Secondary | ICD-10-CM | POA: Diagnosis not present

## 2022-08-18 DIAGNOSIS — B351 Tinea unguium: Secondary | ICD-10-CM | POA: Diagnosis not present

## 2022-08-18 DIAGNOSIS — E7800 Pure hypercholesterolemia, unspecified: Secondary | ICD-10-CM | POA: Diagnosis not present

## 2022-08-18 DIAGNOSIS — E1151 Type 2 diabetes mellitus with diabetic peripheral angiopathy without gangrene: Secondary | ICD-10-CM | POA: Diagnosis not present

## 2022-08-18 DIAGNOSIS — I10 Essential (primary) hypertension: Secondary | ICD-10-CM | POA: Diagnosis not present

## 2022-08-18 DIAGNOSIS — L84 Corns and callosities: Secondary | ICD-10-CM | POA: Diagnosis not present

## 2022-08-18 DIAGNOSIS — E119 Type 2 diabetes mellitus without complications: Secondary | ICD-10-CM | POA: Diagnosis not present

## 2022-08-18 DIAGNOSIS — M19072 Primary osteoarthritis, left ankle and foot: Secondary | ICD-10-CM | POA: Diagnosis not present

## 2022-08-18 DIAGNOSIS — Z0000 Encounter for general adult medical examination without abnormal findings: Secondary | ICD-10-CM | POA: Diagnosis not present

## 2022-08-18 DIAGNOSIS — I70203 Unspecified atherosclerosis of native arteries of extremities, bilateral legs: Secondary | ICD-10-CM | POA: Diagnosis not present

## 2022-08-18 DIAGNOSIS — M792 Neuralgia and neuritis, unspecified: Secondary | ICD-10-CM | POA: Diagnosis not present

## 2022-09-01 DIAGNOSIS — Z23 Encounter for immunization: Secondary | ICD-10-CM | POA: Diagnosis not present

## 2022-09-01 DIAGNOSIS — I10 Essential (primary) hypertension: Secondary | ICD-10-CM | POA: Diagnosis not present

## 2022-09-06 DIAGNOSIS — H269 Unspecified cataract: Secondary | ICD-10-CM | POA: Diagnosis not present

## 2022-09-06 DIAGNOSIS — H2512 Age-related nuclear cataract, left eye: Secondary | ICD-10-CM | POA: Diagnosis not present

## 2022-09-06 DIAGNOSIS — Z961 Presence of intraocular lens: Secondary | ICD-10-CM | POA: Diagnosis not present

## 2022-09-06 DIAGNOSIS — H25812 Combined forms of age-related cataract, left eye: Secondary | ICD-10-CM | POA: Diagnosis not present

## 2022-09-06 DIAGNOSIS — H25012 Cortical age-related cataract, left eye: Secondary | ICD-10-CM | POA: Diagnosis not present

## 2022-09-20 DIAGNOSIS — H269 Unspecified cataract: Secondary | ICD-10-CM | POA: Diagnosis not present

## 2022-09-20 DIAGNOSIS — Z961 Presence of intraocular lens: Secondary | ICD-10-CM | POA: Diagnosis not present

## 2022-09-20 DIAGNOSIS — H52201 Unspecified astigmatism, right eye: Secondary | ICD-10-CM | POA: Diagnosis not present

## 2022-09-20 DIAGNOSIS — H2511 Age-related nuclear cataract, right eye: Secondary | ICD-10-CM | POA: Diagnosis not present

## 2022-09-20 DIAGNOSIS — H25011 Cortical age-related cataract, right eye: Secondary | ICD-10-CM | POA: Diagnosis not present

## 2022-09-20 DIAGNOSIS — H25811 Combined forms of age-related cataract, right eye: Secondary | ICD-10-CM | POA: Diagnosis not present

## 2022-09-29 DIAGNOSIS — Z1231 Encounter for screening mammogram for malignant neoplasm of breast: Secondary | ICD-10-CM | POA: Diagnosis not present

## 2022-10-19 DIAGNOSIS — Z961 Presence of intraocular lens: Secondary | ICD-10-CM | POA: Diagnosis not present

## 2022-11-03 DIAGNOSIS — R0981 Nasal congestion: Secondary | ICD-10-CM | POA: Diagnosis not present

## 2022-11-03 DIAGNOSIS — R051 Acute cough: Secondary | ICD-10-CM | POA: Diagnosis not present

## 2022-11-03 DIAGNOSIS — J0100 Acute maxillary sinusitis, unspecified: Secondary | ICD-10-CM | POA: Diagnosis not present

## 2022-11-17 DIAGNOSIS — M19071 Primary osteoarthritis, right ankle and foot: Secondary | ICD-10-CM | POA: Diagnosis not present

## 2022-11-17 DIAGNOSIS — G629 Polyneuropathy, unspecified: Secondary | ICD-10-CM | POA: Diagnosis not present

## 2022-11-17 DIAGNOSIS — M792 Neuralgia and neuritis, unspecified: Secondary | ICD-10-CM | POA: Diagnosis not present

## 2022-11-17 DIAGNOSIS — I70203 Unspecified atherosclerosis of native arteries of extremities, bilateral legs: Secondary | ICD-10-CM | POA: Diagnosis not present

## 2022-11-17 DIAGNOSIS — E1151 Type 2 diabetes mellitus with diabetic peripheral angiopathy without gangrene: Secondary | ICD-10-CM | POA: Diagnosis not present

## 2022-11-17 DIAGNOSIS — M19072 Primary osteoarthritis, left ankle and foot: Secondary | ICD-10-CM | POA: Diagnosis not present

## 2023-02-09 DIAGNOSIS — M19072 Primary osteoarthritis, left ankle and foot: Secondary | ICD-10-CM | POA: Diagnosis not present

## 2023-02-09 DIAGNOSIS — I70203 Unspecified atherosclerosis of native arteries of extremities, bilateral legs: Secondary | ICD-10-CM | POA: Diagnosis not present

## 2023-02-09 DIAGNOSIS — M792 Neuralgia and neuritis, unspecified: Secondary | ICD-10-CM | POA: Diagnosis not present

## 2023-02-09 DIAGNOSIS — B351 Tinea unguium: Secondary | ICD-10-CM | POA: Diagnosis not present

## 2023-02-09 DIAGNOSIS — E1151 Type 2 diabetes mellitus with diabetic peripheral angiopathy without gangrene: Secondary | ICD-10-CM | POA: Diagnosis not present

## 2023-02-09 DIAGNOSIS — G629 Polyneuropathy, unspecified: Secondary | ICD-10-CM | POA: Diagnosis not present

## 2023-02-09 DIAGNOSIS — L603 Nail dystrophy: Secondary | ICD-10-CM | POA: Diagnosis not present

## 2023-02-09 DIAGNOSIS — M19071 Primary osteoarthritis, right ankle and foot: Secondary | ICD-10-CM | POA: Diagnosis not present

## 2023-02-09 DIAGNOSIS — L84 Corns and callosities: Secondary | ICD-10-CM | POA: Diagnosis not present

## 2023-02-24 DIAGNOSIS — F419 Anxiety disorder, unspecified: Secondary | ICD-10-CM | POA: Diagnosis not present

## 2023-02-24 DIAGNOSIS — I10 Essential (primary) hypertension: Secondary | ICD-10-CM | POA: Diagnosis not present

## 2023-02-24 DIAGNOSIS — E7800 Pure hypercholesterolemia, unspecified: Secondary | ICD-10-CM | POA: Diagnosis not present

## 2023-02-24 DIAGNOSIS — E1165 Type 2 diabetes mellitus with hyperglycemia: Secondary | ICD-10-CM | POA: Diagnosis not present

## 2023-02-24 DIAGNOSIS — E119 Type 2 diabetes mellitus without complications: Secondary | ICD-10-CM | POA: Diagnosis not present

## 2023-02-24 DIAGNOSIS — E1142 Type 2 diabetes mellitus with diabetic polyneuropathy: Secondary | ICD-10-CM | POA: Diagnosis not present

## 2023-05-11 DIAGNOSIS — M19072 Primary osteoarthritis, left ankle and foot: Secondary | ICD-10-CM | POA: Diagnosis not present

## 2023-05-11 DIAGNOSIS — M19071 Primary osteoarthritis, right ankle and foot: Secondary | ICD-10-CM | POA: Diagnosis not present

## 2023-05-11 DIAGNOSIS — L84 Corns and callosities: Secondary | ICD-10-CM | POA: Diagnosis not present

## 2023-05-11 DIAGNOSIS — M792 Neuralgia and neuritis, unspecified: Secondary | ICD-10-CM | POA: Diagnosis not present

## 2023-05-11 DIAGNOSIS — L603 Nail dystrophy: Secondary | ICD-10-CM | POA: Diagnosis not present

## 2023-05-11 DIAGNOSIS — G629 Polyneuropathy, unspecified: Secondary | ICD-10-CM | POA: Diagnosis not present

## 2023-05-11 DIAGNOSIS — E1151 Type 2 diabetes mellitus with diabetic peripheral angiopathy without gangrene: Secondary | ICD-10-CM | POA: Diagnosis not present

## 2023-05-11 DIAGNOSIS — B351 Tinea unguium: Secondary | ICD-10-CM | POA: Diagnosis not present

## 2023-05-11 DIAGNOSIS — I70203 Unspecified atherosclerosis of native arteries of extremities, bilateral legs: Secondary | ICD-10-CM | POA: Diagnosis not present

## 2023-07-14 DIAGNOSIS — E119 Type 2 diabetes mellitus without complications: Secondary | ICD-10-CM | POA: Diagnosis not present

## 2023-07-14 DIAGNOSIS — H52203 Unspecified astigmatism, bilateral: Secondary | ICD-10-CM | POA: Diagnosis not present

## 2023-07-14 DIAGNOSIS — Z961 Presence of intraocular lens: Secondary | ICD-10-CM | POA: Diagnosis not present

## 2023-08-10 DIAGNOSIS — M19071 Primary osteoarthritis, right ankle and foot: Secondary | ICD-10-CM | POA: Diagnosis not present

## 2023-08-10 DIAGNOSIS — I70203 Unspecified atherosclerosis of native arteries of extremities, bilateral legs: Secondary | ICD-10-CM | POA: Diagnosis not present

## 2023-08-10 DIAGNOSIS — G629 Polyneuropathy, unspecified: Secondary | ICD-10-CM | POA: Diagnosis not present

## 2023-08-10 DIAGNOSIS — E1151 Type 2 diabetes mellitus with diabetic peripheral angiopathy without gangrene: Secondary | ICD-10-CM | POA: Diagnosis not present

## 2023-08-10 DIAGNOSIS — L84 Corns and callosities: Secondary | ICD-10-CM | POA: Diagnosis not present

## 2023-08-10 DIAGNOSIS — M792 Neuralgia and neuritis, unspecified: Secondary | ICD-10-CM | POA: Diagnosis not present

## 2023-08-10 DIAGNOSIS — M19072 Primary osteoarthritis, left ankle and foot: Secondary | ICD-10-CM | POA: Diagnosis not present

## 2023-08-10 DIAGNOSIS — L603 Nail dystrophy: Secondary | ICD-10-CM | POA: Diagnosis not present

## 2023-08-10 DIAGNOSIS — B351 Tinea unguium: Secondary | ICD-10-CM | POA: Diagnosis not present

## 2023-08-29 DIAGNOSIS — Z23 Encounter for immunization: Secondary | ICD-10-CM | POA: Diagnosis not present

## 2023-08-29 DIAGNOSIS — I10 Essential (primary) hypertension: Secondary | ICD-10-CM | POA: Diagnosis not present

## 2023-08-29 DIAGNOSIS — E1142 Type 2 diabetes mellitus with diabetic polyneuropathy: Secondary | ICD-10-CM | POA: Diagnosis not present

## 2023-08-29 DIAGNOSIS — Z0000 Encounter for general adult medical examination without abnormal findings: Secondary | ICD-10-CM | POA: Diagnosis not present

## 2023-08-29 DIAGNOSIS — F419 Anxiety disorder, unspecified: Secondary | ICD-10-CM | POA: Diagnosis not present

## 2023-08-29 DIAGNOSIS — K5909 Other constipation: Secondary | ICD-10-CM | POA: Diagnosis not present

## 2023-08-29 DIAGNOSIS — E7800 Pure hypercholesterolemia, unspecified: Secondary | ICD-10-CM | POA: Diagnosis not present

## 2023-08-29 DIAGNOSIS — H6123 Impacted cerumen, bilateral: Secondary | ICD-10-CM | POA: Diagnosis not present

## 2023-08-29 DIAGNOSIS — E559 Vitamin D deficiency, unspecified: Secondary | ICD-10-CM | POA: Diagnosis not present

## 2023-09-05 DIAGNOSIS — H903 Sensorineural hearing loss, bilateral: Secondary | ICD-10-CM | POA: Diagnosis not present

## 2023-09-05 DIAGNOSIS — H6123 Impacted cerumen, bilateral: Secondary | ICD-10-CM | POA: Diagnosis not present

## 2023-09-20 DIAGNOSIS — E6689 Other obesity not elsewhere classified: Secondary | ICD-10-CM | POA: Diagnosis not present

## 2023-09-20 DIAGNOSIS — I10 Essential (primary) hypertension: Secondary | ICD-10-CM | POA: Diagnosis not present

## 2023-09-20 DIAGNOSIS — E7800 Pure hypercholesterolemia, unspecified: Secondary | ICD-10-CM | POA: Diagnosis not present

## 2023-09-20 DIAGNOSIS — Z6834 Body mass index (BMI) 34.0-34.9, adult: Secondary | ICD-10-CM | POA: Diagnosis not present

## 2023-09-20 DIAGNOSIS — E1165 Type 2 diabetes mellitus with hyperglycemia: Secondary | ICD-10-CM | POA: Diagnosis not present

## 2023-10-12 DIAGNOSIS — Z1231 Encounter for screening mammogram for malignant neoplasm of breast: Secondary | ICD-10-CM | POA: Diagnosis not present

## 2023-10-20 DIAGNOSIS — I70203 Unspecified atherosclerosis of native arteries of extremities, bilateral legs: Secondary | ICD-10-CM | POA: Diagnosis not present

## 2023-10-20 DIAGNOSIS — E1151 Type 2 diabetes mellitus with diabetic peripheral angiopathy without gangrene: Secondary | ICD-10-CM | POA: Diagnosis not present

## 2023-10-20 DIAGNOSIS — M19071 Primary osteoarthritis, right ankle and foot: Secondary | ICD-10-CM | POA: Diagnosis not present

## 2023-10-20 DIAGNOSIS — L603 Nail dystrophy: Secondary | ICD-10-CM | POA: Diagnosis not present

## 2023-10-20 DIAGNOSIS — B351 Tinea unguium: Secondary | ICD-10-CM | POA: Diagnosis not present

## 2023-10-20 DIAGNOSIS — G629 Polyneuropathy, unspecified: Secondary | ICD-10-CM | POA: Diagnosis not present

## 2023-10-20 DIAGNOSIS — M19072 Primary osteoarthritis, left ankle and foot: Secondary | ICD-10-CM | POA: Diagnosis not present

## 2023-10-20 DIAGNOSIS — M792 Neuralgia and neuritis, unspecified: Secondary | ICD-10-CM | POA: Diagnosis not present

## 2023-10-20 DIAGNOSIS — L84 Corns and callosities: Secondary | ICD-10-CM | POA: Diagnosis not present

## 2023-12-18 DIAGNOSIS — E785 Hyperlipidemia, unspecified: Secondary | ICD-10-CM | POA: Diagnosis not present

## 2023-12-18 DIAGNOSIS — E669 Obesity, unspecified: Secondary | ICD-10-CM | POA: Diagnosis not present

## 2023-12-18 DIAGNOSIS — E1142 Type 2 diabetes mellitus with diabetic polyneuropathy: Secondary | ICD-10-CM | POA: Diagnosis not present

## 2023-12-18 DIAGNOSIS — E1151 Type 2 diabetes mellitus with diabetic peripheral angiopathy without gangrene: Secondary | ICD-10-CM | POA: Diagnosis not present

## 2023-12-18 DIAGNOSIS — M8580 Other specified disorders of bone density and structure, unspecified site: Secondary | ICD-10-CM | POA: Diagnosis not present

## 2023-12-18 DIAGNOSIS — K5900 Constipation, unspecified: Secondary | ICD-10-CM | POA: Diagnosis not present

## 2023-12-18 DIAGNOSIS — K219 Gastro-esophageal reflux disease without esophagitis: Secondary | ICD-10-CM | POA: Diagnosis not present

## 2023-12-18 DIAGNOSIS — Z8249 Family history of ischemic heart disease and other diseases of the circulatory system: Secondary | ICD-10-CM | POA: Diagnosis not present

## 2023-12-18 DIAGNOSIS — I10 Essential (primary) hypertension: Secondary | ICD-10-CM | POA: Diagnosis not present

## 2024-02-01 DIAGNOSIS — H52203 Unspecified astigmatism, bilateral: Secondary | ICD-10-CM | POA: Diagnosis not present

## 2024-02-01 DIAGNOSIS — Z961 Presence of intraocular lens: Secondary | ICD-10-CM | POA: Diagnosis not present

## 2024-02-01 DIAGNOSIS — E119 Type 2 diabetes mellitus without complications: Secondary | ICD-10-CM | POA: Diagnosis not present

## 2024-02-01 DIAGNOSIS — H35372 Puckering of macula, left eye: Secondary | ICD-10-CM | POA: Diagnosis not present

## 2024-02-12 ENCOUNTER — Encounter: Payer: Self-pay | Admitting: Podiatry

## 2024-02-12 ENCOUNTER — Ambulatory Visit: Payer: Medicare PPO | Admitting: Podiatry

## 2024-02-12 DIAGNOSIS — E1140 Type 2 diabetes mellitus with diabetic neuropathy, unspecified: Secondary | ICD-10-CM | POA: Diagnosis not present

## 2024-02-12 DIAGNOSIS — M79675 Pain in left toe(s): Secondary | ICD-10-CM | POA: Diagnosis not present

## 2024-02-12 DIAGNOSIS — M2041 Other hammer toe(s) (acquired), right foot: Secondary | ICD-10-CM

## 2024-02-12 DIAGNOSIS — H43823 Vitreomacular adhesion, bilateral: Secondary | ICD-10-CM | POA: Diagnosis not present

## 2024-02-12 DIAGNOSIS — M79674 Pain in right toe(s): Secondary | ICD-10-CM | POA: Diagnosis not present

## 2024-02-12 DIAGNOSIS — M2042 Other hammer toe(s) (acquired), left foot: Secondary | ICD-10-CM

## 2024-02-12 DIAGNOSIS — B351 Tinea unguium: Secondary | ICD-10-CM

## 2024-02-12 DIAGNOSIS — H35073 Retinal telangiectasis, bilateral: Secondary | ICD-10-CM | POA: Diagnosis not present

## 2024-02-12 DIAGNOSIS — E119 Type 2 diabetes mellitus without complications: Secondary | ICD-10-CM | POA: Diagnosis not present

## 2024-02-12 DIAGNOSIS — L84 Corns and callosities: Secondary | ICD-10-CM

## 2024-02-12 DIAGNOSIS — H35033 Hypertensive retinopathy, bilateral: Secondary | ICD-10-CM | POA: Diagnosis not present

## 2024-02-12 NOTE — Progress Notes (Signed)
  Subjective:  Patient ID: Catherine Randolph, female    DOB: Jan 16, 1941,  MRN: [id number]  Chief Complaint  Patient presents with   [facility]    [facility] with callous care with diabetic exam. She does have neuropathy. Last A1c 7.3 in Sept. And Takes ASA 56    83 y.o. female presents with the above complaint. History confirmed with patient. Patient presenting with pain related to dystrophic thickened elongated nails. Patient is unable to trim own nails related to nail dystrophy and mobility issues. Patient does  have a history of T2DM. Patient does have callus present located at the bilateral third toes distal tufts associated hammertoes causing pain.   Objective:  Physical Exam: warm to cool, capillary refill 3 to 5 seconds to digits.  Pedal skin atrophic.  Diminished pedal hair growth. nail exam onychomycosis of the toenails, onycholysis, and dystrophic nails DP pulses palpable, PT pulses palpable, protective sensation absent, and vibratory sensation diminished telangiectasias present with some lower extremity edema. Left Foot:  Pain with palpation of nails due to elongation and dystrophic growth.  Hammertoes lesser digits.  Preulcerative callus present distal tuft of third toe Right Foot: Pain with palpation of nails due to elongation and dystrophic growth. Hammertoes lesser digits.  Preulcerative callus present distal tuft of third toe  Assessment:   1. Type 2 diabetes mellitus with diabetic neuropathy, without long-term current use of insulin (HCC)   2. Acquired hammertoes of both feet   3. Pain due to onychomycosis of toenails of both feet   4. Pre-ulcerative calluses      Plan:  Patient was evaluated and treated and all questions answered.  #Hyperkeratotic lesions/pre ulcerative calluses present bilateral third toes distal tufts All symptomatic hyperkeratoses x 2 separate lesions were safely debrided as a courtesy today with a sterile # 312 blade to patient's level of comfort without incident.  We discussed preventative and palliative care of these lesions including supportive and accommodative shoegear, padding, prefabricated and custom molded accommodative orthoses, use of a pumice stone and lotions/creams daily.  #Onychomycosis with pain  -Nails palliatively debrided as below. -Educated on self-care  Procedure: Nail Debridement Rationale: Pain Type of Debridement: manual, sharp debridement. Instrumentation: Nail nipper, rotary burr. Number of Nails: 10  # Diabetic neuropathy with hammertoes Patient educated on diabetes. Discussed proper diabetic foot care and discussed risks and complications of disease. Educated patient in depth on reasons to return to the office immediately should he/she discover anything concerning or new on the feet. All questions answered. Discussed proper shoes as well.  -Has hammertoes causing preulcerative calluses, and neuropathy with loss of protective sensation.  Would benefit diabetic shoes.  Diabetic shoes with 3 sets of custom inserts ordered for patient today.   Return in about 3 months (around 05/11/2024) for Diabetic Foot Care.         Ethan Saddler, DPM [facility] / [facility]

## 2024-03-25 DIAGNOSIS — E7800 Pure hypercholesterolemia, unspecified: Secondary | ICD-10-CM | POA: Diagnosis not present

## 2024-03-25 DIAGNOSIS — E1165 Type 2 diabetes mellitus with hyperglycemia: Secondary | ICD-10-CM | POA: Diagnosis not present

## 2024-03-25 DIAGNOSIS — H35073 Retinal telangiectasis, bilateral: Secondary | ICD-10-CM | POA: Diagnosis not present

## 2024-03-25 DIAGNOSIS — E559 Vitamin D deficiency, unspecified: Secondary | ICD-10-CM | POA: Diagnosis not present

## 2024-03-25 DIAGNOSIS — H43823 Vitreomacular adhesion, bilateral: Secondary | ICD-10-CM | POA: Diagnosis not present

## 2024-03-25 DIAGNOSIS — H35033 Hypertensive retinopathy, bilateral: Secondary | ICD-10-CM | POA: Diagnosis not present

## 2024-03-25 DIAGNOSIS — I10 Essential (primary) hypertension: Secondary | ICD-10-CM | POA: Diagnosis not present

## 2024-03-25 DIAGNOSIS — E119 Type 2 diabetes mellitus without complications: Secondary | ICD-10-CM | POA: Diagnosis not present

## 2024-05-06 ENCOUNTER — Ambulatory Visit: Payer: Medicare PPO | Admitting: Podiatry

## 2024-05-07 ENCOUNTER — Other Ambulatory Visit: Payer: Medicare PPO

## 2024-05-14 ENCOUNTER — Encounter: Payer: Self-pay | Admitting: Podiatry

## 2024-05-14 ENCOUNTER — Ambulatory Visit: Admitting: Podiatry

## 2024-05-14 DIAGNOSIS — M79674 Pain in right toe(s): Secondary | ICD-10-CM | POA: Diagnosis not present

## 2024-05-14 DIAGNOSIS — M2042 Other hammer toe(s) (acquired), left foot: Secondary | ICD-10-CM

## 2024-05-14 DIAGNOSIS — E1140 Type 2 diabetes mellitus with diabetic neuropathy, unspecified: Secondary | ICD-10-CM

## 2024-05-14 DIAGNOSIS — L84 Corns and callosities: Secondary | ICD-10-CM

## 2024-05-14 DIAGNOSIS — B351 Tinea unguium: Secondary | ICD-10-CM

## 2024-05-14 DIAGNOSIS — M79675 Pain in left toe(s): Secondary | ICD-10-CM

## 2024-05-14 DIAGNOSIS — M2041 Other hammer toe(s) (acquired), right foot: Secondary | ICD-10-CM

## 2024-05-14 NOTE — Progress Notes (Signed)
  Subjective:  Patient ID: Catherine Randolph, female    DOB: Feb 27, 1941,  MRN: [id number]  Chief Complaint  Patient presents with   [facility]    [facility] with possible corn. Last A1c was 7.2  and takes ASA 28.     83 y.o. female presents with the above complaint. History confirmed with patient. Patient presenting with pain related to dystrophic thickened elongated nails. Patient is unable to trim own nails related to nail dystrophy and mobility issues. Patient does  have a history of T2DM. Patient does have callus present located at the bilateral third toes distal tufts associated hammertoes causing pain.   Objective:  Physical Exam: warm to cool, capillary refill 3 to 5 seconds to digits.  Pedal skin atrophic.  Diminished pedal hair growth. nail exam onychomycosis of the toenails, onycholysis, and dystrophic nails DP pulses palpable, PT pulses palpable, protective sensation absent, and vibratory sensation diminished telangiectasias present with some lower extremity edema. Left Foot:  Pain with palpation of nails due to elongation and dystrophic growth.  Hammertoes lesser digits semireducible.  Preulcerative callus present distal tuft of third toe Right Foot: Pain with palpation of nails due to elongation and dystrophic growth. Hammertoes lesser digits semireducible.  Preulcerative callus present distal tuft of third toe  Assessment:   1. Type 2 diabetes mellitus with diabetic neuropathy, without long-term current use of insulin (HCC)   2. Acquired hammertoes of both feet   3. Pain due to onychomycosis of toenails of both feet   4. Pre-ulcerative calluses      Plan:  Patient was evaluated and treated and all questions answered.  #Hyperkeratotic lesions/pre ulcerative calluses present bilateral third toes distal tufts All symptomatic hyperkeratoses x 2 separate lesions were safely debrided as a courtesy today with a sterile # 312 blade to patient's level of comfort without incident. We discussed  preventative and palliative care of these lesions including supportive and accommodative shoegear, padding, prefabricated and custom molded accommodative orthoses, use of a pumice stone and lotions/creams daily.  #Onychomycosis with pain  -Nails palliatively debrided as below. -Educated on self-care  Procedure: Nail Debridement Rationale: Pain Type of Debridement: manual, sharp debridement. Instrumentation: Nail nipper, rotary burr. Number of Nails: 10  # Diabetic neuropathy with hammertoes Patient educated on diabetes. Discussed proper diabetic foot care and discussed risks and complications of disease. Educated patient in depth on reasons to return to the office immediately should he/she discover anything concerning or new on the feet. All questions answered. Discussed proper shoes as well.  -Has hammertoes causing preulcerative calluses, and neuropathy with loss of protective sensation.  Would benefit diabetic shoes.  Diabetic shoes with 3 sets of custom inserts ordered for patient today. - New Rx sent to [facility] for diabetic shoes   Return in about 3 months (around 08/14/2024) for Diabetic Foot Care.         Eve Hinders, DPM [facility] / [facility]

## 2024-06-18 DIAGNOSIS — H35073 Retinal telangiectasis, bilateral: Secondary | ICD-10-CM | POA: Diagnosis not present

## 2024-06-18 DIAGNOSIS — H43823 Vitreomacular adhesion, bilateral: Secondary | ICD-10-CM | POA: Diagnosis not present

## 2024-06-18 DIAGNOSIS — H35033 Hypertensive retinopathy, bilateral: Secondary | ICD-10-CM | POA: Diagnosis not present

## 2024-06-18 DIAGNOSIS — E119 Type 2 diabetes mellitus without complications: Secondary | ICD-10-CM | POA: Diagnosis not present

## 2024-07-12 ENCOUNTER — Ambulatory Visit
Admission: RE | Admit: 2024-07-12 | Discharge: 2024-07-12 | Disposition: A | Discharge status: Home / Self Care | Source: Ambulatory Visit | Attending: Family Medicine | Admitting: Family Medicine

## 2024-07-12 ENCOUNTER — Other Ambulatory Visit: Payer: Self-pay | Admitting: Family Medicine

## 2024-07-12 DIAGNOSIS — R10817 Generalized abdominal tenderness: Secondary | ICD-10-CM | POA: Diagnosis not present

## 2024-07-12 DIAGNOSIS — R140 Abdominal distension (gaseous): Secondary | ICD-10-CM | POA: Diagnosis not present

## 2024-07-12 DIAGNOSIS — I700 Atherosclerosis of aorta: Secondary | ICD-10-CM | POA: Diagnosis not present

## 2024-07-12 DIAGNOSIS — N2889 Other specified disorders of kidney and ureter: Secondary | ICD-10-CM | POA: Diagnosis not present

## 2024-07-12 DIAGNOSIS — K5732 Diverticulitis of large intestine without perforation or abscess without bleeding: Secondary | ICD-10-CM | POA: Diagnosis not present

## 2024-07-12 DIAGNOSIS — K5909 Other constipation: Secondary | ICD-10-CM | POA: Diagnosis not present

## 2024-07-12 MED ORDER — IOPAMIDOL (ISOVUE-300) INJECTION 61%
100.0000 mL | Freq: Once | INTRAVENOUS | Status: AC | PRN
  Administered 2024-07-12: 100 mL via INTRAVENOUS

## 2024-07-16 DIAGNOSIS — D4102 Neoplasm of uncertain behavior of left kidney: Secondary | ICD-10-CM | POA: Diagnosis not present

## 2024-07-18 DIAGNOSIS — E1165 Type 2 diabetes mellitus with hyperglycemia: Secondary | ICD-10-CM | POA: Diagnosis not present

## 2024-07-18 DIAGNOSIS — I10 Essential (primary) hypertension: Secondary | ICD-10-CM | POA: Diagnosis not present

## 2024-07-18 DIAGNOSIS — K5909 Other constipation: Secondary | ICD-10-CM | POA: Diagnosis not present

## 2024-07-18 DIAGNOSIS — Z8719 Personal history of other diseases of the digestive system: Secondary | ICD-10-CM | POA: Diagnosis not present

## 2024-07-22 ENCOUNTER — Other Ambulatory Visit: Payer: Self-pay | Admitting: Family Medicine

## 2024-07-22 DIAGNOSIS — N2889 Other specified disorders of kidney and ureter: Secondary | ICD-10-CM

## 2024-08-05 DIAGNOSIS — Z8719 Personal history of other diseases of the digestive system: Secondary | ICD-10-CM | POA: Diagnosis not present

## 2024-08-05 DIAGNOSIS — E7800 Pure hypercholesterolemia, unspecified: Secondary | ICD-10-CM | POA: Diagnosis not present

## 2024-08-05 DIAGNOSIS — K5909 Other constipation: Secondary | ICD-10-CM | POA: Diagnosis not present

## 2024-08-05 DIAGNOSIS — I10 Essential (primary) hypertension: Secondary | ICD-10-CM | POA: Diagnosis not present

## 2024-08-05 DIAGNOSIS — E1165 Type 2 diabetes mellitus with hyperglycemia: Secondary | ICD-10-CM | POA: Diagnosis not present

## 2024-08-13 ENCOUNTER — Ambulatory Visit: Admitting: Podiatry

## 2024-08-13 DIAGNOSIS — B351 Tinea unguium: Secondary | ICD-10-CM

## 2024-08-13 DIAGNOSIS — M79675 Pain in left toe(s): Secondary | ICD-10-CM

## 2024-08-13 DIAGNOSIS — E1140 Type 2 diabetes mellitus with diabetic neuropathy, unspecified: Secondary | ICD-10-CM

## 2024-08-13 DIAGNOSIS — M79674 Pain in right toe(s): Secondary | ICD-10-CM

## 2024-08-13 DIAGNOSIS — L84 Corns and callosities: Secondary | ICD-10-CM

## 2024-08-13 NOTE — Progress Notes (Unsigned)
  Subjective:  Patient ID: Catherine Randolph, female    DOB: March 24, 1941,  MRN: [id number]  Chief Complaint  Patient presents with   [facility]    [facility] with callous A1c was 7 in March, goes in Sept for recheck ASA    83 y.o. female presents with the above complaint. History confirmed with patient. Patient presenting with pain related to dystrophic thickened elongated nails. Patient is unable to trim own nails related to nail dystrophy and mobility issues. Patient does  have a history of T2DM. Patient does have callus present located at the bilateral third toes distal tufts associated hammertoes causing pain.   Objective:  Physical Exam: warm to cool, capillary refill 3 to 5 seconds to digits.  Pedal skin atrophic.  Diminished pedal hair growth. nail exam onychomycosis of the toenails, onycholysis, and dystrophic nails DP pulses palpable, PT pulses palpable, protective sensation absent, and vibratory sensation diminished telangiectasias present with some lower extremity edema. Left Foot:  Pain with palpation of nails due to elongation and dystrophic growth.  Hammertoes lesser digits semireducible.  Preulcerative callus present distal tuft of third toe Right Foot: Pain with palpation of nails due to elongation and dystrophic growth. Hammertoes lesser digits semireducible.  Preulcerative callus present distal tuft of third toe  Assessment:   1. Type 2 diabetes mellitus with diabetic neuropathy, without long-term current use of insulin (HCC)   2. Pain due to onychomycosis of toenails of both feet   3. Pre-ulcerative calluses      Plan:  Patient was evaluated and treated and all questions answered.  #Hyperkeratotic lesions/pre ulcerative calluses present bilateral third toes distal tufts All symptomatic hyperkeratoses x 2 separate lesions were safely debrided today with a sterile # 312 blade to patient's level of comfort without incident. We discussed preventative and palliative care of these lesions  including supportive and accommodative shoegear, padding, prefabricated and custom molded accommodative orthoses, use of a pumice stone and lotions/creams daily.  #Onychomycosis with pain  -Nails palliatively debrided as below. -Educated on self-care  Procedure: Nail Debridement Rationale: Pain Type of Debridement: manual, sharp debridement. Instrumentation: Nail nipper, rotary burr. Number of Nails: 10  # Diabetic neuropathy with hammertoes Patient educated on diabetes. Discussed proper diabetic foot care and discussed risks and complications of disease. Educated patient in depth on reasons to return to the office immediately should he/she discover anything concerning or new on the feet. All questions answered. Discussed proper shoes as well.  -Awaiting diabetic shoes -Did recommend use of Crest pads to offload the 3rd toe pre-ulcerative calluses -Previously discussed flexor tenotomies she is deferring this at this time.   Return in about 3 months (around 11/13/2024) for Diabetic Foot Care.         Ethan Saddler, DPM [facility] / [facility]

## 2024-08-13 NOTE — Patient Instructions (Signed)
 More silicone pads can be purchased from:  [email or web address]  Look for Crest pads, you can find these on [employer] as well.

## 2024-08-14 ENCOUNTER — Encounter: Payer: Self-pay | Admitting: Podiatry

## 2024-09-05 DIAGNOSIS — I10 Essential (primary) hypertension: Secondary | ICD-10-CM | POA: Diagnosis not present

## 2024-09-05 DIAGNOSIS — N289 Disorder of kidney and ureter, unspecified: Secondary | ICD-10-CM | POA: Diagnosis not present

## 2024-09-05 DIAGNOSIS — E1165 Type 2 diabetes mellitus with hyperglycemia: Secondary | ICD-10-CM | POA: Diagnosis not present

## 2024-09-05 DIAGNOSIS — R8281 Pyuria: Secondary | ICD-10-CM | POA: Diagnosis not present

## 2024-09-05 DIAGNOSIS — L989 Disorder of the skin and subcutaneous tissue, unspecified: Secondary | ICD-10-CM | POA: Diagnosis not present

## 2024-09-05 DIAGNOSIS — E559 Vitamin D deficiency, unspecified: Secondary | ICD-10-CM | POA: Diagnosis not present

## 2024-09-05 DIAGNOSIS — E7800 Pure hypercholesterolemia, unspecified: Secondary | ICD-10-CM | POA: Diagnosis not present

## 2024-09-05 DIAGNOSIS — Z0000 Encounter for general adult medical examination without abnormal findings: Secondary | ICD-10-CM | POA: Diagnosis not present

## 2024-09-05 DIAGNOSIS — M85852 Other specified disorders of bone density and structure, left thigh: Secondary | ICD-10-CM | POA: Diagnosis not present

## 2024-09-05 DIAGNOSIS — Z23 Encounter for immunization: Secondary | ICD-10-CM | POA: Diagnosis not present

## 2024-09-10 ENCOUNTER — Ambulatory Visit (INDEPENDENT_AMBULATORY_CARE_PROVIDER_SITE_OTHER): Payer: Medicare PPO | Admitting: Otolaryngology

## 2024-10-09 DIAGNOSIS — E669 Obesity, unspecified: Secondary | ICD-10-CM | POA: Diagnosis not present

## 2024-10-09 DIAGNOSIS — Z7982 Long term (current) use of aspirin: Secondary | ICD-10-CM | POA: Diagnosis not present

## 2024-10-09 DIAGNOSIS — I10 Essential (primary) hypertension: Secondary | ICD-10-CM | POA: Diagnosis not present

## 2024-10-09 DIAGNOSIS — M8580 Other specified disorders of bone density and structure, unspecified site: Secondary | ICD-10-CM | POA: Diagnosis not present

## 2024-10-09 DIAGNOSIS — E785 Hyperlipidemia, unspecified: Secondary | ICD-10-CM | POA: Diagnosis not present

## 2024-10-09 DIAGNOSIS — M1990 Unspecified osteoarthritis, unspecified site: Secondary | ICD-10-CM | POA: Diagnosis not present

## 2024-10-09 DIAGNOSIS — K5900 Constipation, unspecified: Secondary | ICD-10-CM | POA: Diagnosis not present

## 2024-10-09 DIAGNOSIS — M2040 Other hammer toe(s) (acquired), unspecified foot: Secondary | ICD-10-CM | POA: Diagnosis not present

## 2024-10-09 DIAGNOSIS — E119 Type 2 diabetes mellitus without complications: Secondary | ICD-10-CM | POA: Diagnosis not present

## 2024-10-17 DIAGNOSIS — M810 Age-related osteoporosis without current pathological fracture: Secondary | ICD-10-CM | POA: Diagnosis not present

## 2024-10-17 DIAGNOSIS — Z1231 Encounter for screening mammogram for malignant neoplasm of breast: Secondary | ICD-10-CM | POA: Diagnosis not present

## 2024-11-07 DIAGNOSIS — I10 Essential (primary) hypertension: Secondary | ICD-10-CM | POA: Diagnosis not present

## 2024-11-07 DIAGNOSIS — M810 Age-related osteoporosis without current pathological fracture: Secondary | ICD-10-CM | POA: Diagnosis not present

## 2024-11-08 ENCOUNTER — Emergency Department (HOSPITAL_COMMUNITY)
Admission: EM | Admit: 2024-11-08 | Discharge: 2024-11-08 | Discharge status: Absent without leave | Payer: Self-pay | Source: Home / Self Care

## 2024-11-08 ENCOUNTER — Other Ambulatory Visit: Payer: Self-pay

## 2024-11-08 ENCOUNTER — Emergency Department (HOSPITAL_COMMUNITY)

## 2024-11-08 ENCOUNTER — Emergency Department (HOSPITAL_COMMUNITY)
Admission: EM | Admit: 2024-11-08 | Discharge: 2024-11-08 | Disposition: A | Discharge status: Home / Self Care | Attending: Emergency Medicine | Admitting: Emergency Medicine

## 2024-11-08 DIAGNOSIS — M5126 Other intervertebral disc displacement, lumbar region: Secondary | ICD-10-CM | POA: Diagnosis not present

## 2024-11-08 DIAGNOSIS — Z7982 Long term (current) use of aspirin: Secondary | ICD-10-CM | POA: Insufficient documentation

## 2024-11-08 DIAGNOSIS — M2578 Osteophyte, vertebrae: Secondary | ICD-10-CM | POA: Diagnosis not present

## 2024-11-08 DIAGNOSIS — S299XXA Unspecified injury of thorax, initial encounter: Secondary | ICD-10-CM | POA: Diagnosis not present

## 2024-11-08 DIAGNOSIS — I700 Atherosclerosis of aorta: Secondary | ICD-10-CM | POA: Diagnosis not present

## 2024-11-08 DIAGNOSIS — M545 Low back pain, unspecified: Secondary | ICD-10-CM | POA: Insufficient documentation

## 2024-11-08 DIAGNOSIS — S3992XA Unspecified injury of lower back, initial encounter: Secondary | ICD-10-CM | POA: Diagnosis not present

## 2024-11-08 DIAGNOSIS — M5459 Other low back pain: Secondary | ICD-10-CM | POA: Diagnosis not present

## 2024-11-08 DIAGNOSIS — M4807 Spinal stenosis, lumbosacral region: Secondary | ICD-10-CM | POA: Diagnosis not present

## 2024-11-08 DIAGNOSIS — M47816 Spondylosis without myelopathy or radiculopathy, lumbar region: Secondary | ICD-10-CM | POA: Diagnosis not present

## 2024-11-08 MED ORDER — HYDROCODONE-ACETAMINOPHEN 5-325 MG PO TABS: 1.0000 | ORAL_TABLET | Freq: Four times a day (QID) | ORAL | 0 refills | Status: AC | PRN

## 2024-11-08 MED ORDER — DICLOFENAC SODIUM 1 % EX GEL: 2.0000 g | Freq: Four times a day (QID) | CUTANEOUS | 0 refills | Status: AC

## 2024-11-08 MED ORDER — OXYCODONE-ACETAMINOPHEN 5-325 MG PO TABS
1.0000 | ORAL_TABLET | Freq: Once | ORAL | Status: AC
  Administered 2024-11-08: 1 via ORAL
  Filled 2024-11-08: qty 1

## 2024-11-08 NOTE — Discharge Instructions (Addendum)
 Fortunately your CT scan did not show any broken bone.  You may take hydrocodone  pain medication as needed for pain control.  You may also apply Voltaren  gel to the affected area up to 4 times daily to aid with the pain.  Follow-up closely with your doctor for further care.  Be aware hydrocodone  can cause drowsiness and increased risk of falling.

## 2024-11-08 NOTE — ED Triage Notes (Signed)
 Pt ambulatory to triage with complaints of back pain. Pt reports falling 1 week ago, thinks that might be the cause. Pt states that she has been taking tylenol  regularly, but her pain has not improved.

## 2024-11-08 NOTE — ED Provider Notes (Signed)
 [place] EMERGENCY DEPARTMENT AT [facility] Provider Note   CSN: [id number] Arrival date & time: 11/08/24  1657     Patient presents with: No chief complaint on file.   Catherine Randolph is a 83 y.o. female.   The history is provided by the patient, the spouse and medical records. No language interpreter was used.     83 year old female presenting for evaluation of back pain.  Patient states several days ago she accidentally stepped on some uneven ground and fell striking her back against the edge of a grand piano at church.  She hit her left hip and did have some soreness about her hip but that has since improved however for the past 2 to 3 days she has noticed pain to her mid back.  Pain is described as a sharp uncomfort sensation worse with movement.  She tries taking Tylenol  at home without relief.  She does not endorse any abdominal pain no urinary symptoms no blood in urine no numbness no weakness no rash.  She wants make sure she does not have any broken bone.  Prior to Admission medications   Medication Sig Start Date End Date Taking? Authorizing Provider  ACCU-CHEK GUIDE TEST test strip  01/09/24   [provider]  aspirin EC 81 MG tablet Take 81 mg by mouth daily. Swallow whole.    [provider]  hydrochlorothiazide (HYDRODIURIL) 25 MG tablet Take 25 mg by mouth daily. 12/17/23   [provider]  losartan (COZAAR) 50 MG tablet Take 50 mg by mouth daily. 12/19/23   [provider]  metoprolol succinate (TOPROL-XL) 50 MG 24 hr tablet Take 50 mg by mouth daily. 12/17/23   [provider]  simvastatin (ZOCOR) 40 MG tablet Take 40 mg by mouth at bedtime. 01/18/24   [provider]    Allergies: Patient has no known allergies.    Review of Systems  All other systems reviewed and are negative.   Updated Vital Signs BP (!) 162/58 (BP Location: Left Arm)   Pulse (!) 54   Temp 97.8 F (36.6 C) (Oral)   Resp 18    SpO2 98%   Physical Exam Vitals and nursing note reviewed.  Constitutional:      General: She is not in acute distress.    Appearance: She is well-developed.  HENT:     Head: Atraumatic.  Eyes:     Conjunctiva/sclera: Conjunctivae normal.  Cardiovascular:     Rate and Rhythm: Normal rate and regular rhythm.     Pulses: Normal pulses.     Heart sounds: Normal heart sounds.  Pulmonary:     Effort: Pulmonary effort is normal.     Breath sounds: No wheezing, rhonchi or rales.  Chest:     Chest wall: No tenderness.  Abdominal:     Palpations: Abdomen is soft.     Tenderness: There is no abdominal tenderness.  Musculoskeletal:        General: Tenderness (Very mild tenderness noted to the base of thoracic and region of lumbar spine region without any bruising noted.) present.     Cervical back: Neck supple.  Skin:    Findings: No rash.     Comments: Faint bruising noted to posterior left shoulder with normal shoulder range of motion.  Mild tenderness about left hip with normal range of motion.  Neurological:     Mental Status: She is alert.  Psychiatric:        Mood and Affect:  Mood normal.     (all labs ordered are listed, but only abnormal results are displayed) Labs Reviewed - No data to display  EKG: None  Radiology: CT Lumbar Spine Wo Contrast Result Date: 11/08/2024 EXAM: CT OF THE LUMBAR SPINE WITHOUT CONTRAST 11/08/2024 06:21:00 PM TECHNIQUE: CT of the lumbar spine was performed without the administration of intravenous contrast. Multiplanar reformatted images are provided for review. Automated exposure control, iterative reconstruction, and/or weight based adjustment of the mA/kV was utilized to reduce the radiation dose to as low as reasonably achievable. COMPARISON: MR lumbar spine 10/30/2006. CLINICAL HISTORY: Low back pain, trauma. Fall 1 week ago. FINDINGS: BONES AND ALIGNMENT: Normal vertebral body heights. No acute fracture or suspicious bone lesion. Normal  alignment. 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 Fused anterior osteophytes are present at L1-2, L2-3 and L3-4 on the right. Mild left foraminal narrowing is present at L2-3 due to endplate spurring. Mild left subarticular and foraminal narrowing is secondary to broad-based disc protrusion and moderate bilateral facet hypertrophy. Mild right foraminal narrowing is present at L5-S1 secondary to facet hypertrophy. Degenerative changes are present in the SI joints bilaterally. SOFT TISSUES: Atherosclerotic changes and calcifications are present in the aorta and branch vessels without aneurysm. Surgical clips are present at the gallbladder fossa. No acute abnormality. IMPRESSION: 1. No acute traumatic injury identified. 2. Mild multilevel degenerative changes without high-grade canal stenosis or severe foraminal narrowing. Electronically signed by: Lonni Necessary MD 11/08/2024 06:34 PM EST RP Workstation: [id number]   CT Thoracic Spine Wo Contrast Result Date: 11/08/2024 EXAM: CT THORACIC SPINE WITHOUT CONTRAST 11/08/2024 06:21:00 PM TECHNIQUE: CT of the thoracic spine was performed without the administration of intravenous contrast. Multiplanar reformatted images are provided for review. Automated exposure control, iterative reconstruction, and/or weight based adjustment of the mA/kV was utilized to reduce the radiation dose to as low as reasonably achievable. COMPARISON: Comparison to MR lumbar spine with and without contrast dated 12/30/05. 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 Chronic loss of disc height and endplate spurring is present at C6-C7 and C7-T1. Large anterior osteophytes with pseudoarticulation are present at T12-L1. Calcification is present along the posterior longitudinal ligament at T12-L1. DEGENERATIVE CHANGES: No  significant stenosis is present. SOFT TISSUES: Cholecystectomy clips are present. VASCULATURE: Atherosclerotic changes are present in the aorta and branch vessels without aneurysm. Coronary artery calcifications are present. IMPRESSION: 1. 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 2. No significant stenosis. Electronically signed by: Lonni Necessary MD 11/08/2024 06:30 PM EST RP Workstation: [id number]     Procedures   Medications Ordered in the ED  oxyCODONE -acetaminophen  (PERCOCET/ROXICET) 5-325 MG per tablet 1 tablet (1 tablet Oral Given 11/08/24 1800)                                    Medical Decision Making Amount and/or Complexity of Data Reviewed Radiology: ordered.  Risk Prescription drug management.   BP (!) 162/58 (BP Location: Left Arm)   Pulse (!) 54   Temp 97.8 F (36.6 C) (Oral)   Resp 18   SpO2 98%   57:53 PM  83 year old female presenting for evaluation of back pain.  Patient states several days ago she accidentally stepped on some uneven ground and fell striking her back against the edge of  a grand piano at church.  She hit her left hip and did have some soreness about her hip but that has since improved however for the past 2 to 3 days she has noticed pain to her mid back.  Pain is described as a sharp uncomfort sensation worse with movement.  She tries taking Tylenol  at home without relief.  She does not endorse any abdominal pain no urinary symptoms no blood in urine no numbness no weakness no rash.  She wants make sure she does not have any broken bone.  On exam patient is resting comfortably appears to be in no acute discomfort.  She has some minimal tenderness noted to her mid to lower back without any bruising noted.  She does have some faint bruising to her left posterior shoulder but she is able to range her shoulder fine.  She is able to range her left hip normal as well.  No signs of head injury.  Given her recurrent pain, will obtain CT  scan of her thoracic and lumbar spine to rule out occult fracture.  Pain medication given.  Care discussed with Dr. Doretha  7:03 PM CT scan of the thoracic and lumbar spine was obtained, independently viewed inter by me and fortunately no evidence of acute traumatic injury specifically no fracture or dislocation.  There are degenerative changes but this is not acute.  Plan to provide patient with Lidoderm patch to help with symptom control recommend continue with Tylenol , and patient otherwise stable for discharge.  Low suspicion for rib fracture as patient denies any significant shortness of breath or tenderness along her ribs     Final diagnoses:  Acute left-sided low back pain without sciatica    ED Discharge Orders          Ordered    HYDROcodone -acetaminophen  (NORCO/VICODIN) 5-325 MG tablet  Every 6 hours PRN        11/08/24 1918    diclofenac  Sodium (VOLTAREN ) 1 % GEL  4 times daily        11/08/24 1918               Nivia Colon, PA-C 11/08/24 1920    Doretha Folks, MD 11/08/24 [phone]

## 2024-11-12 ENCOUNTER — Other Ambulatory Visit: Payer: Self-pay | Admitting: Urology

## 2024-11-12 ENCOUNTER — Ambulatory Visit: Admitting: Podiatry

## 2024-11-12 ENCOUNTER — Encounter: Payer: Self-pay | Admitting: Podiatry

## 2024-11-12 DIAGNOSIS — F419 Anxiety disorder, unspecified: Secondary | ICD-10-CM | POA: Insufficient documentation

## 2024-11-12 DIAGNOSIS — M79675 Pain in left toe(s): Secondary | ICD-10-CM | POA: Diagnosis not present

## 2024-11-12 DIAGNOSIS — R29818 Other symptoms and signs involving the nervous system: Secondary | ICD-10-CM | POA: Insufficient documentation

## 2024-11-12 DIAGNOSIS — E1140 Type 2 diabetes mellitus with diabetic neuropathy, unspecified: Secondary | ICD-10-CM | POA: Diagnosis not present

## 2024-11-12 DIAGNOSIS — E119 Type 2 diabetes mellitus without complications: Secondary | ICD-10-CM | POA: Insufficient documentation

## 2024-11-12 DIAGNOSIS — E7800 Pure hypercholesterolemia, unspecified: Secondary | ICD-10-CM | POA: Insufficient documentation

## 2024-11-12 DIAGNOSIS — E1165 Type 2 diabetes mellitus with hyperglycemia: Secondary | ICD-10-CM | POA: Insufficient documentation

## 2024-11-12 DIAGNOSIS — B351 Tinea unguium: Secondary | ICD-10-CM

## 2024-11-12 DIAGNOSIS — Z8719 Personal history of other diseases of the digestive system: Secondary | ICD-10-CM | POA: Insufficient documentation

## 2024-11-12 DIAGNOSIS — M2041 Other hammer toe(s) (acquired), right foot: Secondary | ICD-10-CM

## 2024-11-12 DIAGNOSIS — E1142 Type 2 diabetes mellitus with diabetic polyneuropathy: Secondary | ICD-10-CM | POA: Insufficient documentation

## 2024-11-12 DIAGNOSIS — M79674 Pain in right toe(s): Secondary | ICD-10-CM

## 2024-11-12 DIAGNOSIS — I10 Essential (primary) hypertension: Secondary | ICD-10-CM | POA: Insufficient documentation

## 2024-11-12 DIAGNOSIS — D4102 Neoplasm of uncertain behavior of left kidney: Secondary | ICD-10-CM

## 2024-11-12 DIAGNOSIS — N2889 Other specified disorders of kidney and ureter: Secondary | ICD-10-CM | POA: Insufficient documentation

## 2024-11-12 DIAGNOSIS — L84 Corns and callosities: Secondary | ICD-10-CM

## 2024-11-12 DIAGNOSIS — E559 Vitamin D deficiency, unspecified: Secondary | ICD-10-CM | POA: Insufficient documentation

## 2024-11-12 DIAGNOSIS — M2042 Other hammer toe(s) (acquired), left foot: Secondary | ICD-10-CM

## 2024-11-12 DIAGNOSIS — K5909 Other constipation: Secondary | ICD-10-CM | POA: Insufficient documentation

## 2024-11-12 NOTE — Progress Notes (Signed)
  Subjective:  Patient ID: Catherine Randolph, female    DOB: Aug 19, 1941,  MRN: [id number]  Chief Complaint  Patient presents with   Nail Problem    Thick painful toenails, 3 month follow up     83 y.o. female presents with the above complaint. History confirmed with patient. Patient presenting with pain related to dystrophic thickened elongated nails. Patient is unable to trim own nails related to nail dystrophy and mobility issues. Patient does  have a history of T2DM. Patient does have callus present located at the bilateral third toes distal tufts associated hammertoes causing pain.   Objective:  Physical Exam: warm to cool, capillary refill 3 to 5 seconds to digits.  Pedal skin atrophic.  Diminished pedal hair growth. nail exam onychomycosis of the toenails, onycholysis, and dystrophic nails DP pulses palpable, PT pulses palpable, protective sensation absent, and vibratory sensation diminished telangiectasias present with some lower extremity edema. Left Foot:  Pain with palpation of nails due to elongation and dystrophic growth.  Hammertoes lesser digits semireducible.  Preulcerative callus present distal tuft of third toe Right Foot: Pain with palpation of nails due to elongation and dystrophic growth. Hammertoes lesser digits semireducible.  Preulcerative callus present distal tuft of third toe  Assessment:   1. Type 2 diabetes mellitus with diabetic neuropathy, without long-term current use of insulin (HCC)   2. Pain due to onychomycosis of toenails of both feet   3. Pre-ulcerative calluses   4. Acquired hammertoes of both feet      Plan:  Patient was evaluated and treated and all questions answered.  #Hyperkeratotic lesions/pre ulcerative calluses present bilateral third toes distal tufts All symptomatic hyperkeratoses x2 were sharply debrided with a sterile #15 blade, slight bleeding noted, dressed with antibiotic ointment and band aids. We discussed preventative and palliative  care of these lesions including supportive and accommodative shoegear, padding, prefabricated and custom molded accommodative orthoses, use of a pumice stone and lotions/creams daily.  #Onychomycosis with pain  -Nails palliatively debrided as below. -Educated on self-care  Procedure: Nail Debridement Rationale: Pain Type of Debridement: manual, sharp debridement. Instrumentation: Nail nipper, rotary burr. Number of Nails: 10   # Diabetic neuropathy with hammertoes Patient educated on diabetes. Discussed proper diabetic foot care and discussed risks and complications of disease. Educated patient in depth on reasons to return to the office immediately should he/she discover anything concerning or new on the feet. All questions answered. Discussed proper shoes as well.  - Encouraged continued offloading with crest pads - Further discussed flexor tenotomy procedures, she will consider this going forward and will follow-up as needed if she would like to proceed.   Return in about 3 months (around 02/12/2025) for Diabetic Foot Care.         Ethan Saddler, DPM [facility] / [facility]

## 2024-11-18 ENCOUNTER — Encounter: Payer: Self-pay | Admitting: Urology

## 2024-12-21 ENCOUNTER — Ambulatory Visit
Admission: RE | Admit: 2024-12-21 | Discharge: 2024-12-21 | Disposition: A | Source: Ambulatory Visit | Attending: Urology | Admitting: Urology

## 2024-12-21 DIAGNOSIS — D4102 Neoplasm of uncertain behavior of left kidney: Secondary | ICD-10-CM

## 2024-12-21 MED ORDER — GADOPICLENOL 0.5 MMOL/ML IV SOLN
8.0000 mL | Freq: Once | INTRAVENOUS | Status: AC | PRN
  Administered 2024-12-21: 8 mL via INTRAVENOUS

## 2025-02-11 ENCOUNTER — Ambulatory Visit: Admitting: Podiatry
